# Patient Record
Sex: Female | Born: 1965
Health system: Southern US, Community
[De-identification: ages and names within clinical notes are randomized; demographics above are authoritative.]

## PROBLEM LIST (undated history)

## (undated) DIAGNOSIS — M549 Dorsalgia, unspecified: Secondary | ICD-10-CM

## (undated) DIAGNOSIS — I6529 Occlusion and stenosis of unspecified carotid artery: Secondary | ICD-10-CM

## (undated) DIAGNOSIS — I10 Essential (primary) hypertension: Secondary | ICD-10-CM

## (undated) DIAGNOSIS — K219 Gastro-esophageal reflux disease without esophagitis: Secondary | ICD-10-CM

## (undated) HISTORY — PX: SHOULDER SURGERY: SHX246

## (undated) HISTORY — DX: Occlusion and stenosis of unspecified carotid artery: I65.29

## (undated) HISTORY — DX: Essential (primary) hypertension: I10

---

## 1998-07-09 ENCOUNTER — Ambulatory Visit (HOSPITAL_COMMUNITY): Admission: RE | Admit: 1998-07-09 | Discharge: 1998-07-09 | Payer: Self-pay | Admitting: Obstetrics

## 1998-08-18 ENCOUNTER — Encounter: Payer: Self-pay | Admitting: Obstetrics

## 1998-08-18 ENCOUNTER — Ambulatory Visit (HOSPITAL_COMMUNITY): Admission: RE | Admit: 1998-08-18 | Discharge: 1998-08-18 | Payer: Self-pay | Admitting: Obstetrics

## 1999-02-02 ENCOUNTER — Other Ambulatory Visit: Admission: RE | Admit: 1999-02-02 | Discharge: 1999-02-02 | Payer: Self-pay | Admitting: Obstetrics and Gynecology

## 1999-05-22 ENCOUNTER — Encounter: Payer: Self-pay | Admitting: Obstetrics and Gynecology

## 1999-05-22 ENCOUNTER — Inpatient Hospital Stay (HOSPITAL_COMMUNITY): Admission: AD | Admit: 1999-05-22 | Discharge: 1999-06-19 | Payer: Self-pay | Admitting: *Deleted

## 1999-07-15 ENCOUNTER — Other Ambulatory Visit: Admission: RE | Admit: 1999-07-15 | Discharge: 1999-07-15 | Payer: Self-pay | Admitting: Obstetrics and Gynecology

## 2000-08-28 ENCOUNTER — Emergency Department (HOSPITAL_COMMUNITY): Admission: EM | Admit: 2000-08-28 | Discharge: 2000-08-28 | Payer: Self-pay | Admitting: Emergency Medicine

## 2001-04-12 ENCOUNTER — Other Ambulatory Visit: Admission: RE | Admit: 2001-04-12 | Discharge: 2001-04-12 | Payer: Self-pay | Admitting: Obstetrics and Gynecology

## 2001-04-28 ENCOUNTER — Encounter: Admission: RE | Admit: 2001-04-28 | Discharge: 2001-07-27 | Payer: Self-pay | Admitting: Family Medicine

## 2001-08-29 ENCOUNTER — Encounter: Admission: RE | Admit: 2001-08-29 | Discharge: 2001-11-27 | Payer: Self-pay | Admitting: Family Medicine

## 2004-03-08 ENCOUNTER — Emergency Department (HOSPITAL_COMMUNITY): Admission: EM | Admit: 2004-03-08 | Discharge: 2004-03-08 | Payer: Self-pay | Admitting: Emergency Medicine

## 2004-03-08 ENCOUNTER — Encounter (INDEPENDENT_AMBULATORY_CARE_PROVIDER_SITE_OTHER): Payer: Self-pay | Admitting: *Deleted

## 2004-03-12 ENCOUNTER — Inpatient Hospital Stay (HOSPITAL_COMMUNITY): Admission: AD | Admit: 2004-03-12 | Discharge: 2004-03-12 | Payer: Self-pay | Admitting: Obstetrics & Gynecology

## 2004-03-15 ENCOUNTER — Inpatient Hospital Stay (HOSPITAL_COMMUNITY): Admission: AD | Admit: 2004-03-15 | Discharge: 2004-03-15 | Payer: Self-pay | Admitting: Obstetrics & Gynecology

## 2004-04-07 ENCOUNTER — Encounter: Admission: RE | Admit: 2004-04-07 | Discharge: 2004-04-07 | Payer: Self-pay | Admitting: Obstetrics and Gynecology

## 2004-04-09 ENCOUNTER — Ambulatory Visit (HOSPITAL_COMMUNITY): Admission: RE | Admit: 2004-04-09 | Discharge: 2004-04-09 | Payer: Self-pay | Admitting: Obstetrics and Gynecology

## 2004-06-24 ENCOUNTER — Ambulatory Visit: Payer: Self-pay | Admitting: Nurse Practitioner

## 2004-06-25 ENCOUNTER — Ambulatory Visit: Payer: Self-pay | Admitting: Nurse Practitioner

## 2004-06-29 ENCOUNTER — Ambulatory Visit (HOSPITAL_COMMUNITY): Admission: RE | Admit: 2004-06-29 | Discharge: 2004-06-29 | Payer: Self-pay | Admitting: Internal Medicine

## 2004-07-20 ENCOUNTER — Ambulatory Visit: Payer: Self-pay | Admitting: Nurse Practitioner

## 2004-07-23 ENCOUNTER — Ambulatory Visit (HOSPITAL_COMMUNITY): Admission: RE | Admit: 2004-07-23 | Discharge: 2004-07-23 | Payer: Self-pay | Admitting: Internal Medicine

## 2004-08-14 ENCOUNTER — Ambulatory Visit: Payer: Self-pay | Admitting: Nurse Practitioner

## 2004-10-08 ENCOUNTER — Ambulatory Visit: Payer: Self-pay | Admitting: Nurse Practitioner

## 2004-10-09 ENCOUNTER — Ambulatory Visit: Payer: Self-pay | Admitting: *Deleted

## 2004-10-10 ENCOUNTER — Ambulatory Visit (HOSPITAL_COMMUNITY): Admission: RE | Admit: 2004-10-10 | Discharge: 2004-10-10 | Payer: Self-pay | Admitting: Internal Medicine

## 2004-10-30 ENCOUNTER — Ambulatory Visit: Payer: Self-pay | Admitting: Nurse Practitioner

## 2004-11-20 ENCOUNTER — Ambulatory Visit: Payer: Self-pay | Admitting: Nurse Practitioner

## 2004-12-08 ENCOUNTER — Ambulatory Visit: Payer: Self-pay | Admitting: Nurse Practitioner

## 2005-01-07 ENCOUNTER — Ambulatory Visit: Payer: Self-pay | Admitting: Nurse Practitioner

## 2005-02-04 ENCOUNTER — Ambulatory Visit: Payer: Self-pay | Admitting: Nurse Practitioner

## 2005-04-26 ENCOUNTER — Emergency Department (HOSPITAL_COMMUNITY): Admission: EM | Admit: 2005-04-26 | Discharge: 2005-04-27 | Payer: Self-pay | Admitting: Emergency Medicine

## 2006-02-14 ENCOUNTER — Emergency Department (HOSPITAL_COMMUNITY): Admission: EM | Admit: 2006-02-14 | Discharge: 2006-02-14 | Payer: Self-pay | Admitting: Emergency Medicine

## 2006-05-25 ENCOUNTER — Emergency Department (HOSPITAL_COMMUNITY): Admission: EM | Admit: 2006-05-25 | Discharge: 2006-05-26 | Payer: Self-pay | Admitting: *Deleted

## 2006-05-26 ENCOUNTER — Inpatient Hospital Stay (HOSPITAL_COMMUNITY): Admission: RE | Admit: 2006-05-26 | Discharge: 2006-05-28 | Payer: Self-pay | Admitting: Internal Medicine

## 2006-05-31 ENCOUNTER — Ambulatory Visit: Payer: Self-pay | Admitting: Nurse Practitioner

## 2006-06-16 ENCOUNTER — Ambulatory Visit: Payer: Self-pay | Admitting: Nurse Practitioner

## 2006-08-23 ENCOUNTER — Ambulatory Visit: Payer: Self-pay | Admitting: Nurse Practitioner

## 2006-08-31 ENCOUNTER — Encounter (INDEPENDENT_AMBULATORY_CARE_PROVIDER_SITE_OTHER): Payer: Self-pay | Admitting: Specialist

## 2006-08-31 ENCOUNTER — Inpatient Hospital Stay (HOSPITAL_COMMUNITY): Admission: AD | Admit: 2006-08-31 | Discharge: 2006-08-31 | Payer: Self-pay | Admitting: Gynecology

## 2006-09-07 ENCOUNTER — Inpatient Hospital Stay (HOSPITAL_COMMUNITY): Admission: AD | Admit: 2006-09-07 | Discharge: 2006-09-07 | Payer: Self-pay | Admitting: Gynecology

## 2006-09-17 ENCOUNTER — Ambulatory Visit (HOSPITAL_COMMUNITY): Admission: RE | Admit: 2006-09-17 | Discharge: 2006-09-17 | Payer: Self-pay | Admitting: Family Medicine

## 2006-09-19 ENCOUNTER — Ambulatory Visit: Payer: Self-pay | Admitting: Nurse Practitioner

## 2006-10-04 ENCOUNTER — Encounter (INDEPENDENT_AMBULATORY_CARE_PROVIDER_SITE_OTHER): Payer: Self-pay | Admitting: Nurse Practitioner

## 2006-10-04 ENCOUNTER — Ambulatory Visit: Payer: Self-pay | Admitting: Nurse Practitioner

## 2006-10-06 ENCOUNTER — Encounter (INDEPENDENT_AMBULATORY_CARE_PROVIDER_SITE_OTHER): Payer: Self-pay | Admitting: Nurse Practitioner

## 2006-10-10 ENCOUNTER — Encounter: Admission: RE | Admit: 2006-10-10 | Discharge: 2006-10-10 | Payer: Self-pay | Admitting: Family Medicine

## 2006-10-28 ENCOUNTER — Encounter: Admission: RE | Admit: 2006-10-28 | Discharge: 2006-12-09 | Payer: Self-pay | Admitting: Orthopaedic Surgery

## 2006-12-09 ENCOUNTER — Ambulatory Visit (HOSPITAL_COMMUNITY): Admission: RE | Admit: 2006-12-09 | Discharge: 2006-12-09 | Payer: Self-pay | Admitting: Orthopaedic Surgery

## 2006-12-14 ENCOUNTER — Encounter: Admission: RE | Admit: 2006-12-14 | Discharge: 2007-03-14 | Payer: Self-pay | Admitting: Orthopaedic Surgery

## 2006-12-23 ENCOUNTER — Ambulatory Visit: Payer: Self-pay | Admitting: Nurse Practitioner

## 2007-03-15 ENCOUNTER — Encounter: Admission: RE | Admit: 2007-03-15 | Discharge: 2007-06-13 | Payer: Self-pay | Admitting: Orthopaedic Surgery

## 2007-05-05 ENCOUNTER — Encounter (INDEPENDENT_AMBULATORY_CARE_PROVIDER_SITE_OTHER): Payer: Self-pay | Admitting: Nurse Practitioner

## 2007-05-08 DIAGNOSIS — M7580 Other shoulder lesions, unspecified shoulder: Secondary | ICD-10-CM

## 2007-05-08 DIAGNOSIS — E1165 Type 2 diabetes mellitus with hyperglycemia: Secondary | ICD-10-CM | POA: Insufficient documentation

## 2007-05-08 DIAGNOSIS — J309 Allergic rhinitis, unspecified: Secondary | ICD-10-CM | POA: Insufficient documentation

## 2007-05-08 DIAGNOSIS — M545 Low back pain: Secondary | ICD-10-CM

## 2007-05-08 DIAGNOSIS — E119 Type 2 diabetes mellitus without complications: Secondary | ICD-10-CM

## 2007-05-31 ENCOUNTER — Encounter (INDEPENDENT_AMBULATORY_CARE_PROVIDER_SITE_OTHER): Payer: Self-pay | Admitting: *Deleted

## 2007-06-28 ENCOUNTER — Emergency Department (HOSPITAL_COMMUNITY): Admission: EM | Admit: 2007-06-28 | Discharge: 2007-06-29 | Payer: Self-pay | Admitting: Emergency Medicine

## 2007-08-28 ENCOUNTER — Encounter: Admission: RE | Admit: 2007-08-28 | Discharge: 2007-08-28 | Payer: Self-pay | Admitting: Internal Medicine

## 2008-02-03 ENCOUNTER — Encounter: Admission: RE | Admit: 2008-02-03 | Discharge: 2008-02-03 | Payer: Self-pay | Admitting: Orthopaedic Surgery

## 2008-02-23 ENCOUNTER — Encounter: Admission: RE | Admit: 2008-02-23 | Discharge: 2008-03-06 | Payer: Self-pay | Admitting: Orthopaedic Surgery

## 2008-06-19 IMAGING — US US ABDOMEN COMPLETE
1 series · 14 of 25 positions shown · non-contrast
Comparison: None

CLINICAL DATA: Abdominal pain.

COMPLETE ABDOMINAL ULTRASOUND  09/17/2006:

[Series 1: unknown · 0.33mm/px · 14 of 56 slices shown]
[im 1/56]
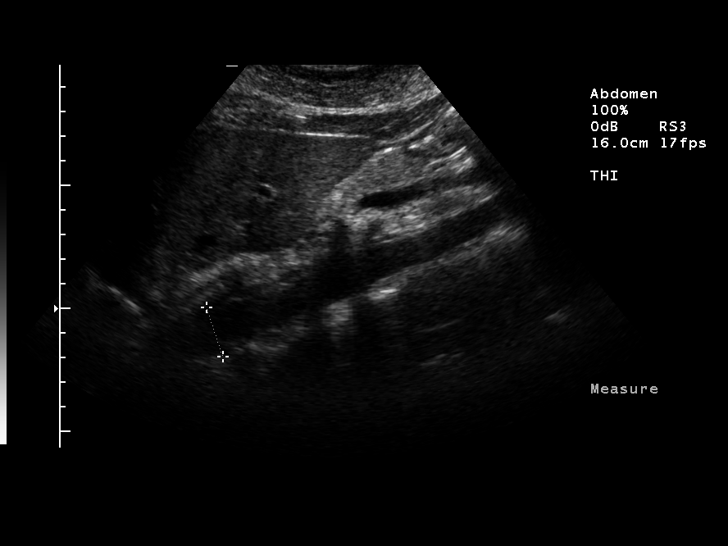
[im 5/56]
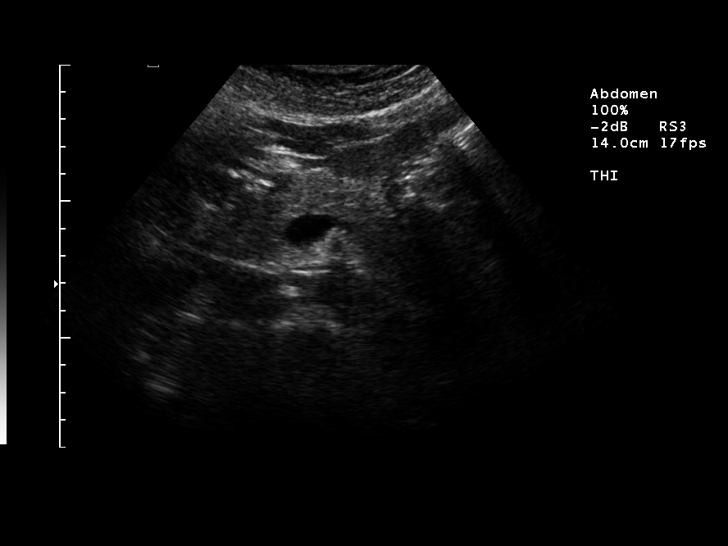
[im 10/56]
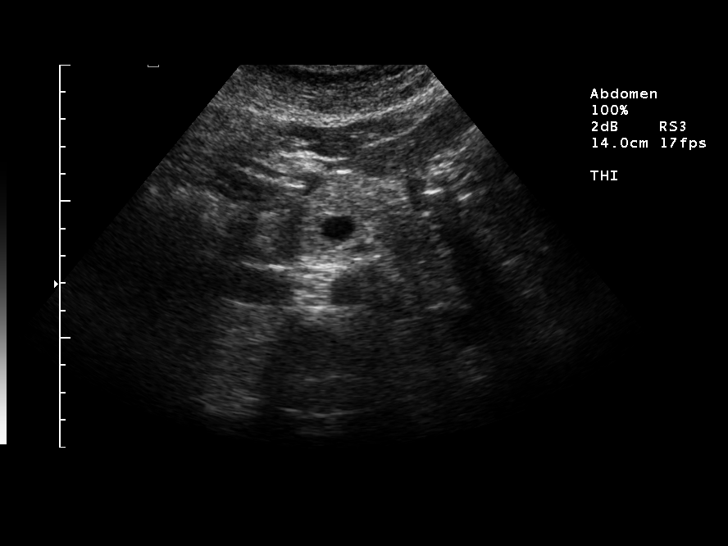
[im 14/56]
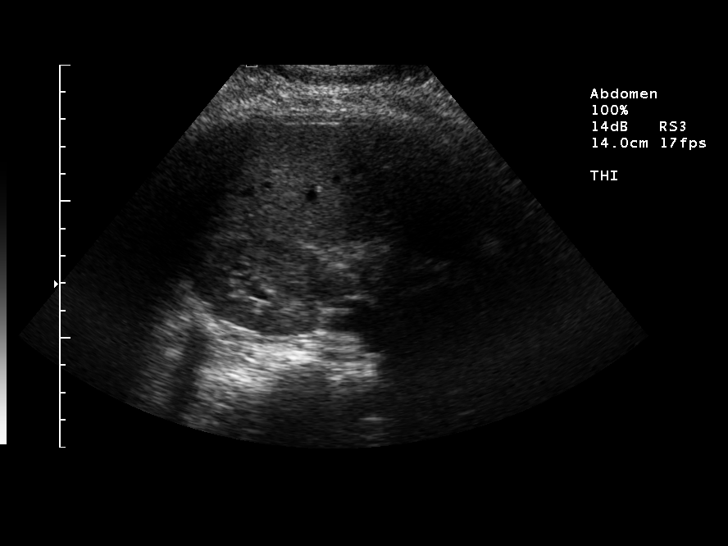
[im 19/56]
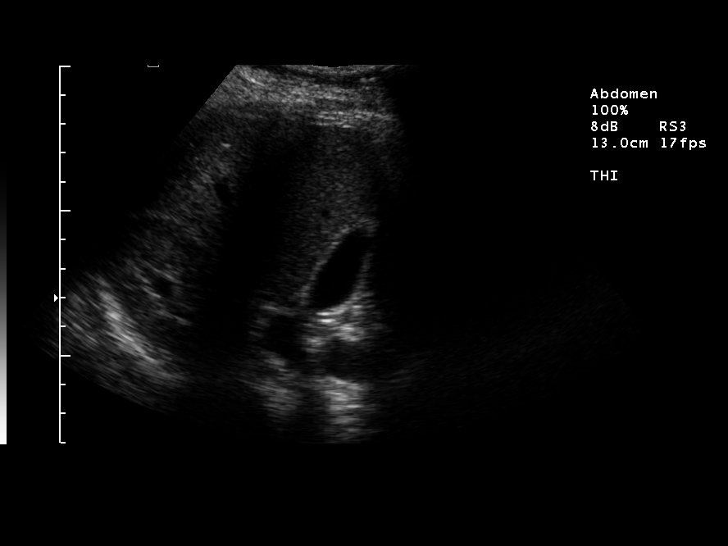
[im 21/56]
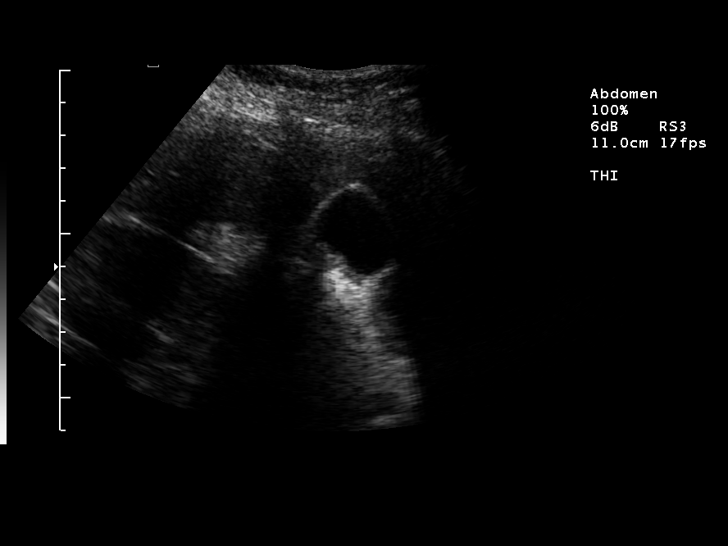
[im 26/56]
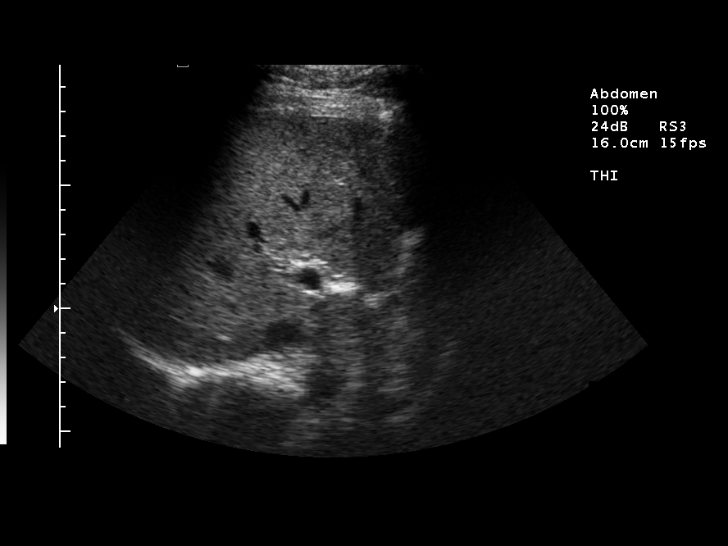
[im 30/56]
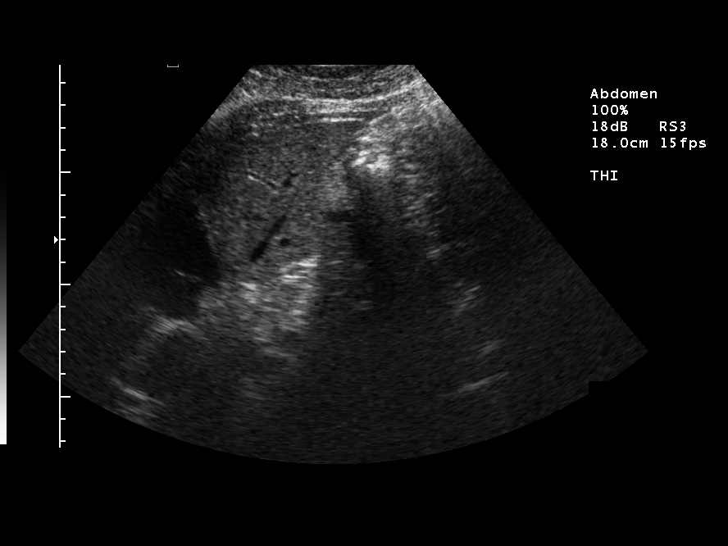
[im 35/56]
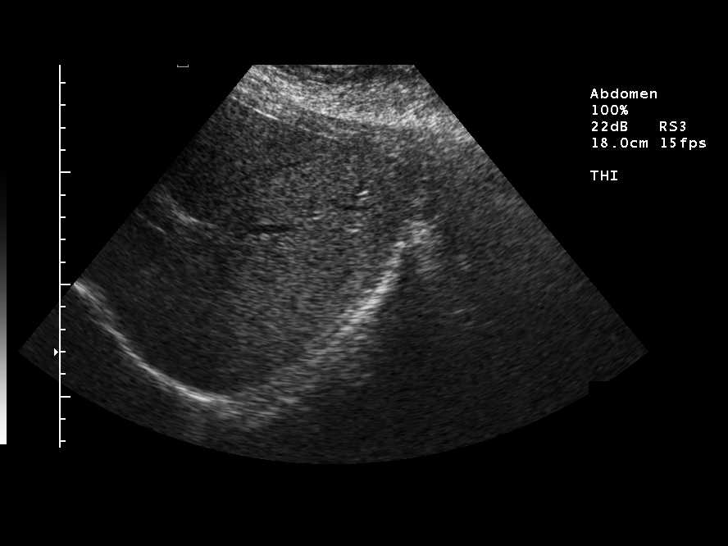
[im 37/56]
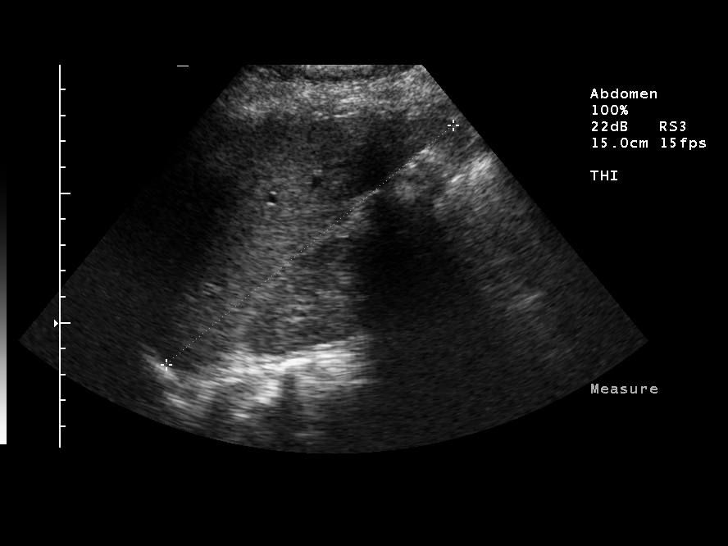
[im 42/56]
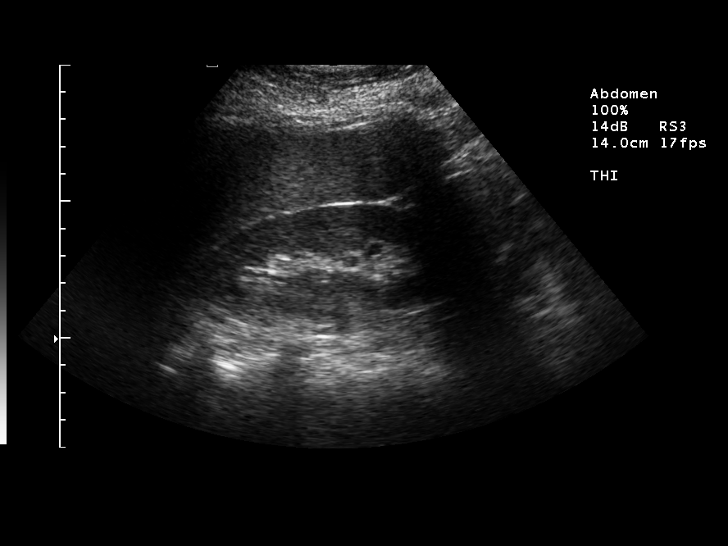
[im 46/56]
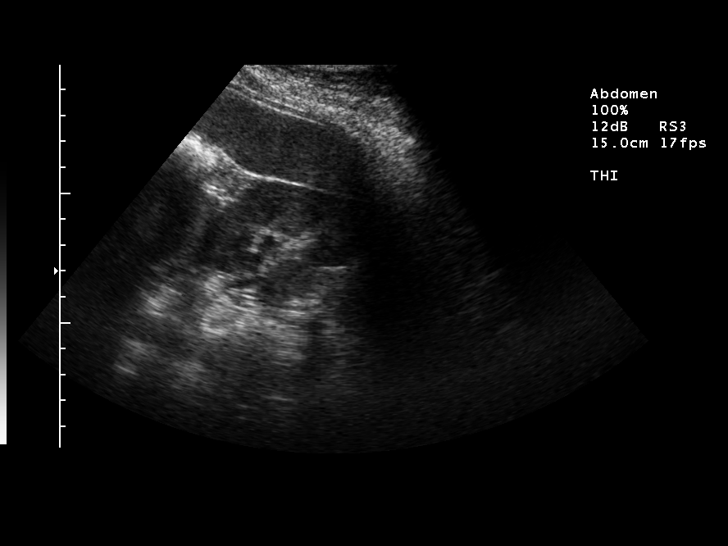
[im 51/56]
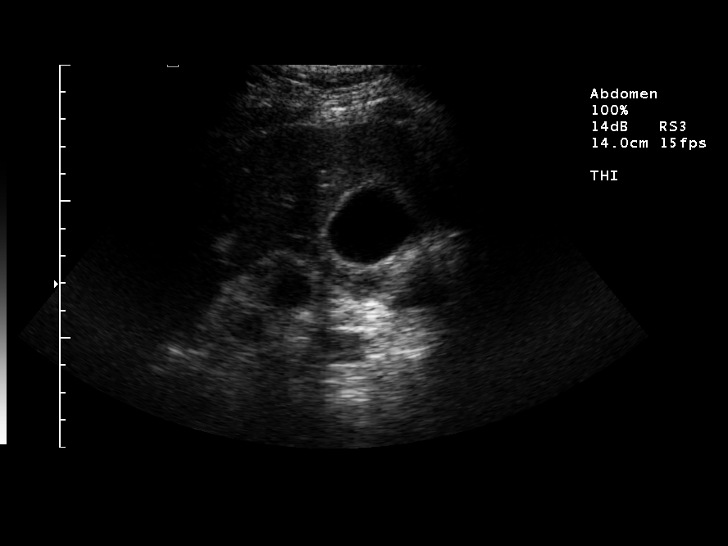
[im 56/56]
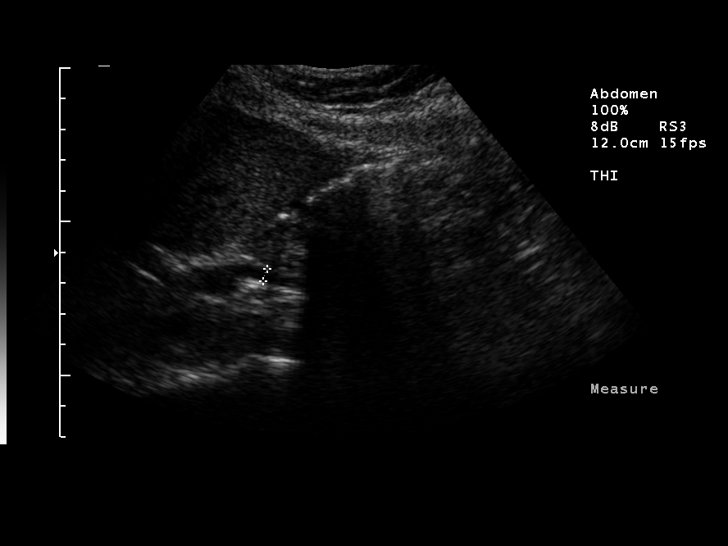

[14 of 25 positions shown; findings below may reference images not displayed]

FINDINGS: Gallbladder:  No gallstones. No gallbladder wall thickening or pericholecystic
fluid.

Common bile duct: Normal in caliber measuring 4 mm diameter.

Liver:  Normal size and echotexture without focal parenchymal abnormality.

Inferior vena cava:  Patent.

Pancreas:  Normal size and echotexture without focal parenchymal abnormality.

Spleen:  No focal parenchymal abnormalities. Borderline enlarged measuring
approximately 13.4 cm in length.

Right kidney:  No hydronephrosis. Well-preserved cortex. No focal parenchymal
abnormalities. Approximately 11.4 cm in length.

Left kidney:  No hydronephrosis there well-preserved cortex. No focal
parenchymal abnormalities. Approximately 10.67 meters in length.

Abdominal aorta:  Normal in appearance with maximum diameter approximately 2 cm.
IMPRESSION: Borderline splenomegaly. Normal abdominal ultrasound otherwise.

## 2008-06-24 ENCOUNTER — Encounter: Admission: RE | Admit: 2008-06-24 | Discharge: 2008-06-24 | Payer: Self-pay | Admitting: Gastroenterology

## 2008-07-24 ENCOUNTER — Ambulatory Visit (HOSPITAL_COMMUNITY): Admission: RE | Admit: 2008-07-24 | Discharge: 2008-07-24 | Payer: Self-pay | Admitting: Gastroenterology

## 2008-07-26 ENCOUNTER — Ambulatory Visit (HOSPITAL_COMMUNITY): Admission: RE | Admit: 2008-07-26 | Discharge: 2008-07-26 | Payer: Self-pay | Admitting: Gastroenterology

## 2009-01-06 ENCOUNTER — Emergency Department (HOSPITAL_COMMUNITY): Admission: EM | Admit: 2009-01-06 | Discharge: 2009-01-06 | Payer: Self-pay | Admitting: Emergency Medicine

## 2010-09-02 ENCOUNTER — Encounter (INDEPENDENT_AMBULATORY_CARE_PROVIDER_SITE_OTHER): Payer: Self-pay | Admitting: Family Medicine

## 2010-09-02 LAB — CONVERTED CEMR LAB
AST: 13 units/L (ref 0–37)
Albumin: 4 g/dL (ref 3.5–5.2)
Alkaline Phosphatase: 70 units/L (ref 39–117)
BUN: 11 mg/dL (ref 6–23)
Basophils Relative: 1 % (ref 0–1)
Creatinine, Ser: 0.76 mg/dL (ref 0.40–1.20)
Eosinophils Absolute: 0 10*3/uL (ref 0.0–0.7)
Eosinophils Relative: 1 % (ref 0–5)
Glucose, Bld: 312 mg/dL — ABNORMAL HIGH (ref 70–99)
HCT: 38.2 % (ref 36.0–46.0)
HDL: 52 mg/dL (ref >39)
LDL Cholesterol: 72 mg/dL (ref 0–99)
Lymphs Abs: 2.2 10*3/uL (ref 0.7–4.0)
MCHC: 31.9 g/dL (ref 30.0–36.0)
MCV: 80.9 fL (ref 78.0–100.0)
Monocytes Absolute: 0.4 10*3/uL (ref 0.1–1.0)
Monocytes Relative: 5 % (ref 3–12)
Neutrophils Relative %: 65 % (ref 43–77)
Potassium: 4.3 meq/L (ref 3.5–5.3)
RBC: 4.72 M/uL (ref 3.87–5.11)
Total Bilirubin: 0.3 mg/dL (ref 0.3–1.2)
Total CHOL/HDL Ratio: 2.7
Triglycerides: 81 mg/dL (ref <150)
VLDL: 16 mg/dL (ref 0–40)
WBC: 7.7 10*3/uL (ref 4.0–10.5)

## 2010-10-04 ENCOUNTER — Encounter: Payer: Self-pay | Admitting: Internal Medicine

## 2010-12-23 LAB — URINE MICROSCOPIC-ADD ON

## 2010-12-23 LAB — URINE CULTURE

## 2010-12-23 LAB — GLUCOSE, CAPILLARY: Glucose-Capillary: 116 mg/dL — ABNORMAL HIGH (ref 70–99)

## 2010-12-23 LAB — URINALYSIS, ROUTINE W REFLEX MICROSCOPIC
Glucose, UA: NEGATIVE mg/dL
Protein, ur: NEGATIVE mg/dL
Specific Gravity, Urine: 1.008 (ref 1.005–1.030)
Urobilinogen, UA: 0.2 mg/dL (ref 0.0–1.0)

## 2010-12-23 LAB — BASIC METABOLIC PANEL
BUN: 10 mg/dL (ref 6–23)
Calcium: 9.3 mg/dL (ref 8.4–10.5)
Chloride: 105 mEq/L (ref 96–112)
Creatinine, Ser: 0.64 mg/dL (ref 0.4–1.2)
GFR calc Af Amer: >60 mL/min (ref >60)

## 2010-12-23 LAB — DIFFERENTIAL
Basophils Relative: 1 % (ref 0–1)
Eosinophils Absolute: 0.1 10*3/uL (ref 0.0–0.7)
Lymphs Abs: 3 10*3/uL (ref 0.7–4.0)
Neutro Abs: 6.2 10*3/uL (ref 1.7–7.7)
Neutrophils Relative %: 65 % (ref 43–77)

## 2010-12-23 LAB — RAPID STREP SCREEN (MED CTR MEBANE ONLY): Streptococcus, Group A Screen (Direct): NEGATIVE

## 2010-12-23 LAB — CBC
MCV: 76.1 fL — ABNORMAL LOW (ref 78.0–100.0)
Platelets: 237 10*3/uL (ref 150–400)
RBC: 4.53 MIL/uL (ref 3.87–5.11)
WBC: 9.7 10*3/uL (ref 4.0–10.5)

## 2011-01-06 ENCOUNTER — Other Ambulatory Visit (HOSPITAL_COMMUNITY): Payer: Self-pay | Admitting: Family Medicine

## 2011-01-06 ENCOUNTER — Ambulatory Visit (HOSPITAL_COMMUNITY)
Admission: RE | Admit: 2011-01-06 | Discharge: 2011-01-06 | Disposition: A | Discharge status: Home / Self Care | Payer: Self-pay | Source: Ambulatory Visit | Attending: Family Medicine | Admitting: Family Medicine

## 2011-01-06 DIAGNOSIS — M25569 Pain in unspecified knee: Secondary | ICD-10-CM | POA: Insufficient documentation

## 2011-01-06 DIAGNOSIS — M545 Low back pain, unspecified: Secondary | ICD-10-CM | POA: Insufficient documentation

## 2011-01-06 DIAGNOSIS — M25559 Pain in unspecified hip: Secondary | ICD-10-CM | POA: Insufficient documentation

## 2011-01-29 NOTE — H&P (Signed)
[name], Robertson                  ACCOUNT NO.:  [id number]   MEDICAL RECORD NO.:  [id number]          PATIENT TYPE:  INP   LOCATION:  1833                         FACILITY:  [facility]   PHYSICIAN:  Deirdre Peer. Polite, M.D. DATE OF BIRTH:  [birth year]   DATE OF ADMISSION:  05/25/2006  DATE OF DISCHARGE:                                HISTORY & PHYSICAL   CHIEF COMPLAINT:  Abdominal pain, nausea, vomiting, fever.   HISTORY OF PRESENT ILLNESS:  A 45 year old female from [place] brought to  the ED by her husband for a three-day history related above in chief  complaint of nausea, vomiting, abdominal pain and temperature.  He stated  his wife has not been able to keep much down over the last couple of days  because of the above-symptoms.  Does admit to some urinary frequency and  dysuria.  In the ED, the patient is evaluated and found to be febrile with  temperature max of 103.3, BP 109/59, pulse 109, respiratory rate 20.   LABS:  UA specific gravity of 1.030, leukocyte esterase and nitrite  positive.  It shows a few bacteria, 21-50 WBCs.  The patient had a CBC with  a white count of 15,000.  BMET essentially unremarkable.   Because of the patient's symptoms, it was felt that admission was deemed  necessary for probable pyelonephritis.  Due to the language barriers, all  other H&P is essentially taken from the patient's husband.  Admission is  deemed necessary for further evaluation and treatment.   PAST MEDICAL HISTORY:  The patient has a history of diabetes but has been  without medications greater than one year.  Denies any other medical  problems.   MEDICATIONS:  Tylenol p.r.n.   SOCIAL HISTORY:  Negative for tobacco, alcohol or drugs.   PAST SURGICAL HISTORY:  C-section in the past.   ALLERGIES:  None.   FAMILY HISTORY:  Noncontributory.   REVIEW OF SYSTEMS:  As stated in the HPI.   PHYSICAL EXAMINATION:  GENERAL:  The patient is in mild distress secondary  to abdominal  discomfort.  VITAL SIGNS:  Temperature max 103.3, BP 109/59, pulse 109, respiratory rate  20, saturating 98%.  HEENT:  Pale sclerae.  Moist oral mucosa.  No nodes.  No JVD.  CHEST:  Clear to auscultation bilaterally without rales, rhonchi or wheezes.  CARDIOVASCULAR:  Regular.  No S3.  ABDOMEN:  Diffuse tenderness with minimal palpation, mild guarding.  No  rebound tenderness.  No mass appreciated.  EXTREMITIES:  No clubbing, cyanosis, or edema.  There are 2+ pulses.  NEUROLOGICAL:  Nonfocal.   LABORATORY DATA:  As stated in the HPI.   ASSESSMENT:  1. Urinary tract infection, probable pyelonephritis.  2. Fever.  3. Leukocytosis.  4. Nausea, vomiting and abdominal pain.  5. History of diabetes off medications greater than one year.   RECOMMENDATIONS:  The patient to be admitted to telemetry for bed.  The  patient will be started on IV fluids, IV antibiotics and will be  pancultured.  The patient will get a CT of the abdomen and  pelvis to rule  out any other occult abdominal pathology.  We will make further  recommendations as deemed necessary.      Deirdre Peer. Polite, M.D.  Electronically Signed     RDP/MEDQ  D:  05/26/2006  T:  05/26/2006  Job:  [id number]

## 2011-01-29 NOTE — Group Therapy Note (Signed)
NAMEMIKYA, DON NO.:  [id number]   MEDICAL RECORD NO.:  [id number]                   PATIENT TYPE:  OUT   LOCATION:  [facility]                           FACILITY:  [facility]   PHYSICIAN:  Argentina Donovan, MD                     DATE OF BIRTH:  [birth year]   DATE OF SERVICE:                                    CLINIC NOTE   The patient is a 45 year old [place] African female from [place] who is a  gravida 12, para 10-09-13-08, with last two babies born by cesarean section  who has spontaneous first trimester miscarriage in late June and was seen at  [facility].  She has been using Depo-Provera for contraception mainly because she  does not like pills around the house with her young children, the youngest  of which is 99 years old, that like to take pills.  Since the pregnancy,  she has had low back pain in the sacroiliac area and is extremely tender to  palpation in that area, especially on her right side, but does not remember  an episode where she hurt her back, but she is having to take pain  medication continually.  An ultrasound of the pelvis was completely normal  that was done on March 12, 2004, and will follow this up with an MRI of the  lower back, lumbar and sacroiliac areas and probably send the patient for  orthopedic consult after that.  The impression is follow-up normal following  spontaneous first trimester abortion and severe lower back pain.                                               Argentina Donovan, MD    PR/MEDQ  D:  04/07/2004  T:  04/07/2004  Job:  [id number]
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
## 2011-06-15 ENCOUNTER — Ambulatory Visit: Payer: Medicaid Other | Attending: Family Medicine | Admitting: Rehabilitative and Restorative Service Providers"

## 2011-06-15 DIAGNOSIS — M546 Pain in thoracic spine: Secondary | ICD-10-CM | POA: Insufficient documentation

## 2011-06-15 DIAGNOSIS — M6281 Muscle weakness (generalized): Secondary | ICD-10-CM | POA: Insufficient documentation

## 2011-06-15 DIAGNOSIS — M545 Low back pain, unspecified: Secondary | ICD-10-CM | POA: Insufficient documentation

## 2011-06-15 DIAGNOSIS — M25519 Pain in unspecified shoulder: Secondary | ICD-10-CM | POA: Insufficient documentation

## 2011-06-15 DIAGNOSIS — IMO0001 Reserved for inherently not codable concepts without codable children: Secondary | ICD-10-CM | POA: Insufficient documentation

## 2011-06-17 ENCOUNTER — Ambulatory Visit: Payer: Medicaid Other | Admitting: Rehabilitation

## 2011-06-22 ENCOUNTER — Ambulatory Visit: Payer: Medicaid Other | Admitting: Rehabilitation

## 2011-06-23 LAB — DIFFERENTIAL
Eosinophils Absolute: 0.1
Eosinophils Relative: 1
Lymphocytes Relative: 25
Lymphs Abs: 2.8
Monocytes Absolute: 0.5

## 2011-06-23 LAB — COMPREHENSIVE METABOLIC PANEL
ALT: 13
AST: 16
Albumin: 3.5
CO2: 25
Calcium: 9.1
Chloride: 104
Creatinine, Ser: 0.71
GFR calc Af Amer: >60
Sodium: 136
Total Bilirubin: 0.3

## 2011-06-23 LAB — CBC
MCV: 73.7 — ABNORMAL LOW
Platelets: 240
RBC: 4.95
WBC: 11.5 — ABNORMAL HIGH

## 2011-06-23 LAB — URINE MICROSCOPIC-ADD ON

## 2011-06-23 LAB — URINALYSIS, ROUTINE W REFLEX MICROSCOPIC
Bilirubin Urine: NEGATIVE
Ketones, ur: NEGATIVE
Specific Gravity, Urine: 1.027
pH: 6.5

## 2011-06-23 LAB — POCT PREGNANCY, URINE: Preg Test, Ur: NEGATIVE

## 2011-06-29 ENCOUNTER — Ambulatory Visit: Payer: Medicaid Other | Admitting: Rehabilitation

## 2011-07-01 ENCOUNTER — Ambulatory Visit: Payer: Medicaid Other | Admitting: Rehabilitation

## 2011-07-05 ENCOUNTER — Ambulatory Visit: Payer: Medicaid Other | Admitting: Rehabilitative and Restorative Service Providers"

## 2011-07-07 ENCOUNTER — Encounter: Payer: Self-pay | Admitting: Rehabilitation

## 2011-07-14 ENCOUNTER — Ambulatory Visit: Payer: Medicaid Other | Admitting: Rehabilitation

## 2011-07-15 ENCOUNTER — Ambulatory Visit: Payer: Medicaid Other | Attending: Family Medicine | Admitting: Rehabilitative and Restorative Service Providers"

## 2011-07-15 DIAGNOSIS — IMO0001 Reserved for inherently not codable concepts without codable children: Secondary | ICD-10-CM | POA: Insufficient documentation

## 2011-07-15 DIAGNOSIS — M545 Low back pain, unspecified: Secondary | ICD-10-CM | POA: Insufficient documentation

## 2011-07-15 DIAGNOSIS — M546 Pain in thoracic spine: Secondary | ICD-10-CM | POA: Insufficient documentation

## 2011-07-15 DIAGNOSIS — M25519 Pain in unspecified shoulder: Secondary | ICD-10-CM | POA: Insufficient documentation

## 2011-07-15 DIAGNOSIS — M2560 Stiffness of unspecified joint, not elsewhere classified: Secondary | ICD-10-CM | POA: Insufficient documentation

## 2011-07-15 DIAGNOSIS — M6281 Muscle weakness (generalized): Secondary | ICD-10-CM | POA: Insufficient documentation

## 2011-07-19 ENCOUNTER — Encounter: Payer: Self-pay | Admitting: Rehabilitative and Restorative Service Providers"

## 2011-07-21 ENCOUNTER — Encounter: Payer: Self-pay | Admitting: Rehabilitative and Restorative Service Providers"

## 2011-07-23 ENCOUNTER — Encounter: Payer: Self-pay | Admitting: Physical Therapy

## 2011-07-27 ENCOUNTER — Encounter: Payer: Self-pay | Admitting: Rehabilitative and Restorative Service Providers"

## 2011-07-28 ENCOUNTER — Encounter: Payer: Self-pay | Admitting: Rehabilitative and Restorative Service Providers"

## 2011-07-30 ENCOUNTER — Encounter: Payer: Self-pay | Admitting: Physical Therapy

## 2011-08-02 ENCOUNTER — Encounter: Payer: Self-pay | Admitting: Rehabilitative and Restorative Service Providers"

## 2011-08-03 ENCOUNTER — Encounter: Payer: Self-pay | Admitting: Rehabilitative and Restorative Service Providers"

## 2011-08-04 ENCOUNTER — Encounter: Payer: Self-pay | Admitting: Physical Therapy

## 2012-03-12 ENCOUNTER — Emergency Department (HOSPITAL_COMMUNITY): Payer: Medicaid Other

## 2012-03-12 ENCOUNTER — Encounter (HOSPITAL_COMMUNITY): Payer: Self-pay | Admitting: Emergency Medicine

## 2012-03-12 ENCOUNTER — Emergency Department (HOSPITAL_COMMUNITY)
Admission: EM | Admit: 2012-03-12 | Discharge: 2012-03-13 | Disposition: A | Discharge status: Home / Self Care | Payer: Medicaid Other | Source: Home / Self Care | Attending: Emergency Medicine | Admitting: Emergency Medicine

## 2012-03-12 DIAGNOSIS — M7989 Other specified soft tissue disorders: Secondary | ICD-10-CM | POA: Insufficient documentation

## 2012-03-12 DIAGNOSIS — R0602 Shortness of breath: Secondary | ICD-10-CM | POA: Insufficient documentation

## 2012-03-12 DIAGNOSIS — R05 Cough: Secondary | ICD-10-CM | POA: Insufficient documentation

## 2012-03-12 DIAGNOSIS — M549 Dorsalgia, unspecified: Secondary | ICD-10-CM | POA: Insufficient documentation

## 2012-03-12 DIAGNOSIS — Z79899 Other long term (current) drug therapy: Secondary | ICD-10-CM | POA: Insufficient documentation

## 2012-03-12 DIAGNOSIS — E119 Type 2 diabetes mellitus without complications: Secondary | ICD-10-CM | POA: Insufficient documentation

## 2012-03-12 DIAGNOSIS — R059 Cough, unspecified: Secondary | ICD-10-CM | POA: Insufficient documentation

## 2012-03-12 DIAGNOSIS — I10 Essential (primary) hypertension: Secondary | ICD-10-CM | POA: Insufficient documentation

## 2012-03-12 HISTORY — DX: Essential (primary) hypertension: I10

## 2012-03-12 HISTORY — DX: Dorsalgia, unspecified: M54.9

## 2012-03-12 LAB — BASIC METABOLIC PANEL
Chloride: 98 mEq/L (ref 96–112)
GFR calc non Af Amer: 75 mL/min — ABNORMAL LOW (ref >90)
Glucose, Bld: 346 mg/dL — ABNORMAL HIGH (ref 70–99)
Potassium: 4.1 mEq/L (ref 3.5–5.1)
Sodium: 136 mEq/L (ref 135–145)

## 2012-03-12 LAB — CBC WITH DIFFERENTIAL/PLATELET
Eosinophils Absolute: 0.1 10*3/uL (ref 0.0–0.7)
Hemoglobin: 11.5 g/dL — ABNORMAL LOW (ref 12.0–15.0)
Lymphocytes Relative: 24 % (ref 12–46)
Lymphs Abs: 2.6 10*3/uL (ref 0.7–4.0)
MCH: 26 pg (ref 26.0–34.0)
Neutro Abs: 8 10*3/uL — ABNORMAL HIGH (ref 1.7–7.7)
Neutrophils Relative %: 72 % (ref 43–77)
Platelets: 194 10*3/uL (ref 150–400)
RBC: 4.43 MIL/uL (ref 3.87–5.11)
WBC: 11 10*3/uL — ABNORMAL HIGH (ref 4.0–10.5)

## 2012-03-12 MED ORDER — ENOXAPARIN SODIUM 30 MG/0.3ML ~~LOC~~ SOLN: 30.0000 mg | Freq: Two times a day (BID) | SUBCUTANEOUS | Status: DC

## 2012-03-12 MED ORDER — MORPHINE SULFATE 4 MG/ML IJ SOLN
4.0000 mg | Freq: Once | INTRAMUSCULAR | Status: AC
  Administered 2012-03-12: 4 mg via INTRAVENOUS
  Filled 2012-03-12: qty 1

## 2012-03-12 MED ORDER — IOHEXOL 350 MG/ML SOLN
100.0000 mL | Freq: Once | INTRAVENOUS | Status: AC | PRN
  Administered 2012-03-12: 100 mL via INTRAVENOUS

## 2012-03-12 MED ORDER — HYDROCODONE-ACETAMINOPHEN 5-500 MG PO TABS: 1.0000 | ORAL_TABLET | Freq: Four times a day (QID) | ORAL | Status: AC | PRN

## 2012-03-12 MED ORDER — ENOXAPARIN SODIUM 80 MG/0.8ML ~~LOC~~ SOLN
1.0000 mg/kg | Freq: Once | SUBCUTANEOUS | Status: AC
  Administered 2012-03-12: 80 mg via SUBCUTANEOUS
  Filled 2012-03-12: qty 0.8

## 2012-03-12 MED ORDER — ONDANSETRON HCL 4 MG/2ML IJ SOLN
4.0000 mg | Freq: Once | INTRAMUSCULAR | Status: AC
  Administered 2012-03-13: 4 mg via INTRAVENOUS

## 2012-03-12 MED ORDER — CYCLOBENZAPRINE HCL 10 MG PO TABS: 5.0000 mg | ORAL_TABLET | Freq: Two times a day (BID) | ORAL | Status: AC | PRN

## 2012-03-12 MED ORDER — HYDROMORPHONE HCL 2 MG PO TABS: 1.0000 mg | ORAL_TABLET | Freq: Once | ORAL | Status: DC

## 2012-03-12 MED ORDER — ONDANSETRON HCL 4 MG/2ML IJ SOLN: 4.0000 mg | Freq: Once | INTRAMUSCULAR | Status: DC

## 2012-03-12 MED ORDER — HYDROMORPHONE HCL PF 1 MG/ML IJ SOLN
1.0000 mg | Freq: Once | INTRAMUSCULAR | Status: AC
  Administered 2012-03-12: 1 mg via INTRAVENOUS
  Filled 2012-03-12: qty 1

## 2012-03-12 NOTE — ED Notes (Signed)
The patient is AOx4 and comfortable with her discharge instructions.  The patient's husband is driving her home.

## 2012-03-12 NOTE — ED Notes (Signed)
C/o difficulty breathing and R upper back pain/swelling since Friday.

## 2012-03-12 NOTE — ED Notes (Signed)
Patient transported to X-ray 

## 2012-03-12 NOTE — Discharge Instructions (Signed)
Back Pain, Adult Low back pain is very common. About 1 in 5 people have back pain.The cause of low back pain is rarely dangerous. The pain often gets better over time.About half of people with a sudden onset of back pain feel better in just 2 weeks. About 8 in 10 people feel better by 6 weeks.  CAUSES Some common causes of back pain include:  Strain of the muscles or ligaments supporting the spine.   Wear and tear (degeneration) of the spinal discs.   Arthritis.   Direct injury to the back.  DIAGNOSIS Most of the time, the direct cause of low back pain is not known.However, back pain can be treated effectively even when the exact cause of the pain is unknown.Answering your caregiver's questions about your overall health and symptoms is one of the most accurate ways to make sure the cause of your pain is not dangerous. If your caregiver needs more information, he or she may order lab work or imaging tests (X-rays or MRIs).However, even if imaging tests show changes in your back, this usually does not require surgery. HOME CARE INSTRUCTIONS For many people, back pain returns.Since low back pain is rarely dangerous, it is often a condition that people can learn to manageon their own.   Remain active. It is stressful on the back to sit or stand in one place. Do not sit, drive, or stand in one place for more than 30 minutes at a time. Take short walks on level surfaces as soon as pain allows.Try to increase the length of time you walk each day.   Do not stay in bed.Resting more than 1 or 2 days can delay your recovery.   Do not avoid exercise or work.Your body is made to move.It is not dangerous to be active, even though your back may hurt.Your back will likely heal faster if you return to being active before your pain is gone.   Pay attention to your body when you bend and lift. Many people have less discomfortwhen lifting if they bend their knees, keep the load close to their  bodies,and avoid twisting. Often, the most comfortable positions are those that put less stress on your recovering back.   Find a comfortable position to sleep. Use a firm mattress and lie on your side with your knees slightly bent. If you lie on your back, put a pillow under your knees.   Only take over-the-counter or prescription medicines as directed by your caregiver. Over-the-counter medicines to reduce pain and inflammation are often the most helpful.Your caregiver may prescribe muscle relaxant drugs.These medicines help dull your pain so you can more quickly return to your normal activities and healthy exercise.   Put ice on the injured area.   Put ice in a plastic bag.   Place a towel between your skin and the bag.   Leave the ice on for 15 to 20 minutes, 3 to 4 times a day for the first 2 to 3 days. After that, ice and heat may be alternated to reduce pain and spasms.   Ask your caregiver about trying back exercises and gentle massage. This may be of some benefit.   Avoid feeling anxious or stressed.Stress increases muscle tension and can worsen back pain.It is important to recognize when you are anxious or stressed and learn ways to manage it.Exercise is a great option.  SEEK MEDICAL CARE IF:  You have pain that is not relieved with rest or medicine.   You have   pain that does not improve in 1 week.   You have new symptoms.   You are generally not feeling well.  SEEK IMMEDIATE MEDICAL CARE IF:   You have pain that radiates from your back into your legs.   You develop new bowel or bladder control problems.   You have unusual weakness or numbness in your arms or legs.   You develop nausea or vomiting.   You develop abdominal pain.   You feel faint.  Document Released: 08/30/2005 Document Revised: 08/19/2011 Document Reviewed: 01/18/2011 [facility] Patient Information 2012 [place], [place].Peripheral Edema You have swelling in your legs (peripheral edema). This  swelling is due to excess accumulation of salt and water in your body. Edema may be a sign of heart, kidney or liver disease, or a side effect of a medication. It may also be due to problems in the leg veins. Elevating your legs and using special support stockings may be very helpful, if the cause of the swelling is due to poor venous circulation. Avoid long periods of standing, whatever the cause. Treatment of edema depends on identifying the cause. Chips, pretzels, pickles and other salty foods should be avoided. Restricting salt in your diet is almost always needed. Water pills (diuretics) are often used to remove the excess salt and water from your body via urine. These medicines prevent the kidney from reabsorbing sodium. This increases urine flow. Diuretic treatment may also result in lowering of potassium levels in your body. Potassium supplements may be needed if you have to use diuretics daily. Daily weights can help you keep track of your progress in clearing your edema. You should call your caregiver for follow up care as recommended. SEEK IMMEDIATE MEDICAL CARE IF:   You have increased swelling, pain, redness, or heat in your legs.   You develop shortness of breath, especially when lying down.   You develop chest or abdominal pain, weakness, or fainting.   You have a fever.  Document Released: 10/07/2004 Document Revised: 08/19/2011 Document Reviewed: 09/17/2009 [facility] Patient Information 2012 [place], [place].

## 2012-03-12 NOTE — ED Provider Notes (Addendum)
BP 126/75  Pulse 82  Temp 98.3 F (36.8 C) (Oral)  Resp 19  Ht 5\' 2"  (1.575 m)  Wt 172 lb (78.019 kg)  BMI 31.46 kg/m2  SpO2 99%  LMP 02/16/2012  I saw and evaluated the patient, reviewed the resident's note and I agree with the findings and plan, ekg interpretation (reviewed by me). Pt c/o SOB only when pain in back severe. Has been present intermittently x 2 years, now not better with hot water packs/biofreeze/ultram prescribed by PMD. Seen for same 2 weeks ago. No SOB at this time. She does c/o R posterior/popliteal pain and subj leg swelling. No PE by CTA. No appreciable swelling by exam. +Popliteal ttp. +b/l upper thoracic/paraspinal ttp with muscle spasm, reproducible pain. Chest/back Pain constant > 6 hours with negative trop. EKG nondiag. No PE by CTA. Will treat for msk pain. She will receive lovenox in the ED and f/u for VUS RLE tomorrow morning. No EMC precluding discharge at this time. Given Precautions for return. PMD and U/S f/u.    Forbes Cellar, MD 03/12/12 1610  Forbes Cellar, MD 03/12/12 [phone]

## 2012-03-12 NOTE — ED Notes (Signed)
The patient denies any recent long distance travel.  She is a nonsmoker.  She denies use or oral contraceptives.  She denies any hx of blood clots.  She denies any increased level of exertion prior to the pain.

## 2012-03-12 NOTE — ED Provider Notes (Signed)
History     CSN: [id number]  Arrival date & time 03/12/12  1721   First MD Initiated Contact with Patient 03/12/12 1919      Chief Complaint  Patient presents with  . Shortness of Breath    Patient is a 46 y.o. female presenting with shortness of breath. The history is provided by the patient and the spouse. The history is limited by a language barrier. A language interpreter was used (Arabic).  Shortness of Breath  The current episode started 2 days ago. The onset was gradual. The problem occurs continuously. The problem has been gradually worsening. The problem is moderate. The symptoms are relieved by rest. The symptoms are aggravated by activity. Associated symptoms include cough (productive) and shortness of breath. Pertinent negatives include no chest pain, no fever and no wheezing.    Past Medical History  Diagnosis Date  . Diabetes mellitus   . Back pain   . Hypertension     Past Surgical History  Procedure Date  . Shoulder surgery     No family history on file.  History  Substance Use Topics  . Smoking status: Never Smoker   . Smokeless tobacco: Not on file  . Alcohol Use: No    OB History    Grav Para Term Preterm Abortions TAB SAB Ect Mult Living                  Review of Systems  Constitutional: Negative for fever, chills, activity change and appetite change.  HENT: Negative for neck pain and neck stiffness.   Respiratory: Positive for cough (productive) and shortness of breath. Negative for chest tightness and wheezing.   Cardiovascular: Negative for chest pain and palpitations.  Gastrointestinal: Negative for vomiting, abdominal pain, diarrhea and constipation.  Genitourinary: Negative for dysuria, decreased urine volume and difficulty urinating.  Musculoskeletal: Positive for myalgias (right upper back) and back pain (at site of swelling).  Skin: Negative for rash and wound.  Neurological: Negative for seizures, syncope and light-headedness.    Psychiatric/Behavioral: Negative for confusion and agitation.  All other systems reviewed and are negative.    Allergies  Metformin  Home Medications   Current Outpatient Rx  Name Route Sig Dispense Refill  . ALBUTEROL SULFATE HFA 108 (90 BASE) MCG/ACT IN AERS Inhalation Inhale 2 puffs into the lungs 4 (four) times daily.    Marland Kitchen AMITRIPTYLINE HCL 25 MG PO TABS Oral Take 25 mg by mouth at bedtime.    Marland Kitchen GLIMEPIRIDE 4 MG PO TABS Oral Take 4 mg by mouth daily before breakfast.    . LOSARTAN POTASSIUM 50 MG PO TABS Oral Take 50 mg by mouth daily.    . MELOXICAM 15 MG PO TABS Oral Take 15 mg by mouth daily.    Marland Kitchen MENTHOL (TOPICAL ANALGESIC) 4 % EX GEL Apply externally Apply 1 application topically daily as needed. For pain    . METHOCARBAMOL 500 MG PO TABS Oral Take 500 mg by mouth 2 (two) times daily.    Marland Kitchen SITAGLIPTIN PHOSPHATE 100 MG PO TABS Oral Take 100 mg by mouth daily.    . TRAMADOL HCL 50 MG PO TABS Oral Take 50 mg by mouth every 6 (six) hours as needed. For pain      BP 126/75  Pulse 82  Temp 98.3 F (36.8 C) (Oral)  Resp 19  Ht 5\' 2"  (1.575 m)  Wt 172 lb (78.019 kg)  BMI 31.46 kg/m2  SpO2 99%  LMP 02/16/2012  Physical  Exam  Nursing note and vitals reviewed. Constitutional: She is oriented to person, place, and time. She appears well-developed and well-nourished.       Appears anxious   HENT:  Head: Normocephalic and atraumatic.  Right Ear: External ear normal.  Left Ear: External ear normal.  Nose: Nose normal.  Mouth/Throat: Oropharynx is clear and moist. No oropharyngeal exudate.  Eyes: Conjunctivae are normal. Pupils are equal, round, and reactive to light.  Neck: Normal range of motion. Neck supple.  Cardiovascular: Normal rate, regular rhythm, normal heart sounds and intact distal pulses.  Exam reveals no gallop and no friction rub.   No murmur heard. Pulmonary/Chest: Effort normal and breath sounds normal. No respiratory distress. She has no wheezes. She has  no rales. She exhibits no tenderness.  Abdominal: Soft. Bowel sounds are normal. She exhibits no distension and no mass. There is no tenderness. There is no rebound and no guarding.  Musculoskeletal: Normal range of motion. She exhibits no edema and no tenderness.  Neurological: She is alert and oriented to person, place, and time.  Skin: Skin is warm and dry.       Edema overlying right upper back, tender to palpation  Psychiatric: She has a normal mood and affect. Her behavior is normal. Judgment and thought content normal.    ED Course  Procedures (including critical care time)  Labs Reviewed  CBC WITH DIFFERENTIAL - Abnormal; Notable for the following:    WBC 11.0 (*)     Hemoglobin 11.5 (*)     HCT 34.6 (*)     Neutro Abs 8.0 (*)     All other components within normal limits  BASIC METABOLIC PANEL - Abnormal; Notable for the following:    Glucose, Bld 346 (*)     GFR calc non Af Amer 75 (*)     GFR calc Af Amer 87 (*)     All other components within normal limits  TROPONIN I   Dg Chest 2 View  03/12/2012  *RADIOLOGY REPORT*  Clinical Data: Shortness of breath.  CHEST - 2 VIEW  Comparison: 01/06/2009  Findings: Heart and mediastinal contours are within normal limits. No focal opacities or effusions.  No acute bony abnormality.  IMPRESSION: No active cardiopulmonary disease.  Original Report Authenticated By: Cyndie Chime, M.D.   Ct Angio Chest W/cm &/or Wo Cm  03/12/2012  *RADIOLOGY REPORT*  Clinical Data: Pleuritic chest pain  CT ANGIOGRAPHY CHEST  Technique:  Multidetector CT imaging of the chest using the standard protocol during bolus administration of intravenous contrast. Multiplanar reconstructed images including MIPs were obtained and reviewed to evaluate the vascular anatomy.  Contrast: OMNIPAQUE IOHEXOL 350 MG/ML SOLN  Comparison: 03/12/2012 chest radiograph  Findings: No pulmonary arterial branch filling defect identified. Normal caliber aorta.  Heart size upper  normal to mildly enlarged. No pleural or pericardial effusion.  No intrathoracic lymphadenopathy.  Limited images through the upper abdomen show no acute abnormality.  Small filling defect along the dependent wall of the trachea likely reflects mucus or debris.  Small polyp considered less likely but not excluded.  There is respiratory motion which degrades detailed parenchymal evaluation.  Mild dependent atelectasis.  Otherwise, no focal consolidation.  No pneumothorax.  No acute osseous finding.  IMPRESSION: No pulmonary embolism or acute intrathoracic process identified.  Heart size upper normal to mildly enlarged.  Original Report Authenticated By: Waneta Martins, M.D.     1. Leg swelling   2. Back pain  Date: 03/12/2012  Rate: 82 bpm  Rhythm: normal sinus rhythm  QRS Axis: normal  Intervals: normal  ST/T Wave abnormalities: normal  Conduction Disutrbances:none  Narrative Interpretation: No evidence of acute ischemia or arrythmia  Old EKG Reviewed: unchanged (12/07/06)    MDM  46 yo F presents for 2 days of pleuritic back pain, dyspnea, and cough productive of yellow phlegm. She also states that she has had edema and pain of her right leg for 2 days. No history of trauma to back at site of pain. AFVSS. Mild edema of legs bilaterally and edema to upper right back compared with left back, which does appear to be tender to palpation. Patient has not been out of the country in 17 years. Due to dyspnea, pleuritic nature of pain, and recent right leg edema/pain, chest CTA obtained and negative for PE. Imaging also negative for evidence of pneumonia in this afebrile patient. However, because of leg edema, will prescribe Lovenox and have patient follow-up for outpatient DVT study. Patient given return precautions, including worsening of signs or symptoms. Patient instructed to follow-up with primary care physician.   Discussion through Arabic interpreter via phone.         Clemetine Marker, MD 03/12/12 2322

## 2012-03-13 ENCOUNTER — Ambulatory Visit (HOSPITAL_COMMUNITY)
Admit: 2012-03-13 | Discharge: 2012-03-13 | Disposition: A | Discharge status: Home / Self Care | Payer: Medicaid Other | Attending: Emergency Medicine | Admitting: Emergency Medicine

## 2012-03-13 ENCOUNTER — Emergency Department (HOSPITAL_COMMUNITY)
Admission: EM | Admit: 2012-03-13 | Discharge: 2012-03-13 | Disposition: A | Discharge status: Home / Self Care | Payer: Medicaid Other | Attending: Emergency Medicine | Admitting: Emergency Medicine

## 2012-03-13 DIAGNOSIS — Z79899 Other long term (current) drug therapy: Secondary | ICD-10-CM | POA: Insufficient documentation

## 2012-03-13 DIAGNOSIS — I10 Essential (primary) hypertension: Secondary | ICD-10-CM | POA: Insufficient documentation

## 2012-03-13 DIAGNOSIS — R079 Chest pain, unspecified: Secondary | ICD-10-CM | POA: Insufficient documentation

## 2012-03-13 DIAGNOSIS — R059 Cough, unspecified: Secondary | ICD-10-CM | POA: Insufficient documentation

## 2012-03-13 DIAGNOSIS — M79609 Pain in unspecified limb: Secondary | ICD-10-CM

## 2012-03-13 DIAGNOSIS — M7989 Other specified soft tissue disorders: Secondary | ICD-10-CM | POA: Insufficient documentation

## 2012-03-13 DIAGNOSIS — E119 Type 2 diabetes mellitus without complications: Secondary | ICD-10-CM | POA: Insufficient documentation

## 2012-03-13 DIAGNOSIS — M545 Low back pain: Secondary | ICD-10-CM

## 2012-03-13 DIAGNOSIS — R05 Cough: Secondary | ICD-10-CM | POA: Insufficient documentation

## 2012-03-13 DIAGNOSIS — M549 Dorsalgia, unspecified: Secondary | ICD-10-CM | POA: Insufficient documentation

## 2012-03-13 LAB — GLUCOSE, CAPILLARY: Glucose-Capillary: 243 mg/dL — ABNORMAL HIGH (ref 70–99)

## 2012-03-13 MED ORDER — ONDANSETRON HCL 4 MG/2ML IJ SOLN
INTRAMUSCULAR | Status: AC
  Filled 2012-03-13: qty 2

## 2012-03-13 NOTE — Progress Notes (Signed)
VASCULAR LAB PRELIMINARY  PRELIMINARY  PRELIMINARY  PRELIMINARY  Right lower extremity venous duplex completed.    Preliminary report: Right leg is negative for deep and superficial vein thrombosis.    Porschea Borys,  RVT 03/13/2012, 3:10 PM

## 2012-11-21 ENCOUNTER — Other Ambulatory Visit: Payer: Self-pay

## 2012-11-21 DIAGNOSIS — Z1231 Encounter for screening mammogram for malignant neoplasm of breast: Secondary | ICD-10-CM

## 2012-12-19 ENCOUNTER — Ambulatory Visit
Admission: RE | Admit: 2012-12-19 | Discharge: 2012-12-19 | Disposition: A | Discharge status: Home / Self Care | Payer: Medicaid Other | Source: Ambulatory Visit

## 2012-12-19 DIAGNOSIS — Z1231 Encounter for screening mammogram for malignant neoplasm of breast: Secondary | ICD-10-CM

## 2013-03-08 ENCOUNTER — Emergency Department (INDEPENDENT_AMBULATORY_CARE_PROVIDER_SITE_OTHER): Payer: Medicaid Other

## 2013-03-08 ENCOUNTER — Emergency Department (HOSPITAL_COMMUNITY)
Admission: EM | Admit: 2013-03-08 | Discharge: 2013-03-08 | Disposition: A | Discharge status: Home / Self Care | Payer: Medicaid Other | Source: Home / Self Care

## 2013-03-08 ENCOUNTER — Encounter (HOSPITAL_COMMUNITY): Payer: Self-pay | Admitting: Emergency Medicine

## 2013-03-08 DIAGNOSIS — M25519 Pain in unspecified shoulder: Secondary | ICD-10-CM

## 2013-03-08 DIAGNOSIS — S139XXA Sprain of joints and ligaments of unspecified parts of neck, initial encounter: Secondary | ICD-10-CM

## 2013-03-08 DIAGNOSIS — S86911A Strain of unspecified muscle(s) and tendon(s) at lower leg level, right leg, initial encounter: Secondary | ICD-10-CM

## 2013-03-08 DIAGNOSIS — M25511 Pain in right shoulder: Secondary | ICD-10-CM

## 2013-03-08 DIAGNOSIS — IMO0002 Reserved for concepts with insufficient information to code with codable children: Secondary | ICD-10-CM

## 2013-03-08 DIAGNOSIS — S161XXA Strain of muscle, fascia and tendon at neck level, initial encounter: Secondary | ICD-10-CM

## 2013-03-08 DIAGNOSIS — S93401A Sprain of unspecified ligament of right ankle, initial encounter: Secondary | ICD-10-CM

## 2013-03-08 DIAGNOSIS — S93409A Sprain of unspecified ligament of unspecified ankle, initial encounter: Secondary | ICD-10-CM

## 2013-03-08 MED ORDER — HYDROCODONE-ACETAMINOPHEN 7.5-325 MG PO TABS: 1.0000 | ORAL_TABLET | Freq: Four times a day (QID) | ORAL | Status: DC | PRN

## 2013-03-08 NOTE — ED Notes (Signed)
Pt reports slipping and falling on a wet floor today around 10:30 a.m, falling back with right leg folder behind her.  Pt is c/o right led, shoulder and neck pain. Pt has not taken any otc meds for pain relief.  Pt is alert and sitting up right with no acute signs of distress.

## 2013-03-08 NOTE — ED Notes (Signed)
History of surgery on the right shoulder.
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
## 2013-03-12 NOTE — ED Provider Notes (Signed)
Medical screening examination/treatment/procedure(s) were performed by non-physician practitioner and as supervising physician I was immediately available for consultation/collaboration.  Leslee Home, M.D.   Reuben Likes, MD 03/12/13 2129

## 2013-05-25 ENCOUNTER — Encounter: Payer: Medicaid Other | Admitting: Internal Medicine

## 2013-09-14 ENCOUNTER — Ambulatory Visit (HOSPITAL_COMMUNITY)
Admission: RE | Admit: 2013-09-14 | Discharge: 2013-09-14 | Disposition: A | Discharge status: Home / Self Care | Payer: Medicaid Other | Source: Ambulatory Visit | Attending: Internal Medicine | Admitting: Internal Medicine

## 2013-09-14 ENCOUNTER — Other Ambulatory Visit (HOSPITAL_COMMUNITY): Payer: Self-pay | Admitting: Internal Medicine

## 2013-09-14 DIAGNOSIS — M25559 Pain in unspecified hip: Secondary | ICD-10-CM | POA: Insufficient documentation

## 2013-09-14 DIAGNOSIS — M79609 Pain in unspecified limb: Secondary | ICD-10-CM

## 2013-09-14 NOTE — Progress Notes (Signed)
VASCULAR LAB PRELIMINARY  PRELIMINARY  PRELIMINARY  PRELIMINARY  Left lower extremity venous duplex completed.    Preliminary report:  Left:  No evidence of DVT, superficial thrombosis, or Baker's cyst.  Rudy Luhmann, RVT 09/14/2013, 5:14 PM

## 2014-02-05 ENCOUNTER — Emergency Department (HOSPITAL_COMMUNITY): Payer: Medicaid Other

## 2014-02-05 ENCOUNTER — Emergency Department (HOSPITAL_COMMUNITY)
Admission: EM | Admit: 2014-02-05 | Discharge: 2014-02-05 | Disposition: A | Discharge status: Home / Self Care | Payer: Medicaid Other | Attending: Emergency Medicine | Admitting: Emergency Medicine

## 2014-02-05 ENCOUNTER — Encounter (HOSPITAL_COMMUNITY): Payer: Self-pay | Admitting: Emergency Medicine

## 2014-02-05 DIAGNOSIS — M79609 Pain in unspecified limb: Secondary | ICD-10-CM | POA: Insufficient documentation

## 2014-02-05 DIAGNOSIS — R079 Chest pain, unspecified: Secondary | ICD-10-CM | POA: Insufficient documentation

## 2014-02-05 DIAGNOSIS — G8911 Acute pain due to trauma: Secondary | ICD-10-CM | POA: Insufficient documentation

## 2014-02-05 DIAGNOSIS — E119 Type 2 diabetes mellitus without complications: Secondary | ICD-10-CM | POA: Insufficient documentation

## 2014-02-05 DIAGNOSIS — Z79899 Other long term (current) drug therapy: Secondary | ICD-10-CM | POA: Insufficient documentation

## 2014-02-05 DIAGNOSIS — I10 Essential (primary) hypertension: Secondary | ICD-10-CM | POA: Insufficient documentation

## 2014-02-05 DIAGNOSIS — IMO0001 Reserved for inherently not codable concepts without codable children: Secondary | ICD-10-CM | POA: Insufficient documentation

## 2014-02-05 DIAGNOSIS — R42 Dizziness and giddiness: Secondary | ICD-10-CM | POA: Insufficient documentation

## 2014-02-05 DIAGNOSIS — M7918 Myalgia, other site: Secondary | ICD-10-CM

## 2014-02-05 DIAGNOSIS — M549 Dorsalgia, unspecified: Secondary | ICD-10-CM | POA: Insufficient documentation

## 2014-02-05 LAB — I-STAT CHEM 8, ED
BUN: 12 mg/dL (ref 6–23)
CALCIUM ION: 1.16 mmol/L (ref 1.12–1.23)
CREATININE: 0.9 mg/dL (ref 0.50–1.10)
Chloride: 102 mEq/L (ref 96–112)
GLUCOSE: 212 mg/dL — AB (ref 70–99)
HCT: 39 % (ref 36.0–46.0)
HEMOGLOBIN: 13.3 g/dL (ref 12.0–15.0)
Potassium: 4.3 mEq/L (ref 3.7–5.3)
SODIUM: 141 meq/L (ref 137–147)
TCO2: 25 mmol/L (ref 0–100)

## 2014-02-05 MED ORDER — HYDROCODONE-ACETAMINOPHEN 5-325 MG PO TABS: 1.0000 | ORAL_TABLET | Freq: Four times a day (QID) | ORAL | Status: DC | PRN

## 2014-02-05 MED ORDER — ONDANSETRON 4 MG PO TBDP
8.0000 mg | ORAL_TABLET | Freq: Once | ORAL | Status: AC
  Administered 2014-02-05: 8 mg via ORAL
  Filled 2014-02-05: qty 2

## 2014-02-05 MED ORDER — HYDROCODONE-ACETAMINOPHEN 5-325 MG PO TABS
2.0000 | ORAL_TABLET | Freq: Once | ORAL | Status: AC
  Administered 2014-02-05: 2 via ORAL
  Filled 2014-02-05: qty 2

## 2014-02-05 MED ORDER — ONDANSETRON HCL 4 MG PO TABS: 4.0000 mg | ORAL_TABLET | Freq: Four times a day (QID) | ORAL | Status: DC
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
## 2014-02-05 NOTE — ED Provider Notes (Signed)
CT scans and x-ray, reviewed all within normal limits.  Patient will be discharged home with a prescription for Norco and Zofran.  Arman Filter, NP 02/05/14 2153
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
## 2014-02-05 NOTE — ED Notes (Signed)
Pt ambulates with slow steady gait
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
## 2014-02-06 NOTE — ED Provider Notes (Signed)
Medical screening examination/treatment/procedure(s) were performed by non-physician practitioner and as supervising physician I was immediately available for consultation/collaboration.   EKG Interpretation None       Toy Baker, MD 02/06/14 1719

## 2014-02-06 NOTE — ED Provider Notes (Signed)
Medical screening examination/treatment/procedure(s) were conducted as a shared visit with non-physician practitioner(s) and myself.  I personally evaluated the patient during the encounter.   EKG Interpretation None       Toy Baker, MD 02/06/14 1719

## 2014-10-21 ENCOUNTER — Other Ambulatory Visit: Payer: Self-pay

## 2014-10-21 DIAGNOSIS — Z1231 Encounter for screening mammogram for malignant neoplasm of breast: Secondary | ICD-10-CM

## 2014-10-30 ENCOUNTER — Other Ambulatory Visit: Payer: Self-pay | Admitting: Internal Medicine

## 2014-10-30 ENCOUNTER — Ambulatory Visit
Admission: RE | Admit: 2014-10-30 | Discharge: 2014-10-30 | Disposition: A | Discharge status: Home / Self Care | Payer: Medicaid Other | Source: Ambulatory Visit

## 2014-10-30 DIAGNOSIS — R928 Other abnormal and inconclusive findings on diagnostic imaging of breast: Secondary | ICD-10-CM

## 2014-10-30 DIAGNOSIS — Z1231 Encounter for screening mammogram for malignant neoplasm of breast: Secondary | ICD-10-CM

## 2014-11-06 ENCOUNTER — Other Ambulatory Visit: Payer: Medicaid Other

## 2015-02-14 ENCOUNTER — Ambulatory Visit
Admission: RE | Admit: 2015-02-14 | Discharge: 2015-02-14 | Disposition: A | Discharge status: Home / Self Care | Payer: Medicaid Other | Source: Ambulatory Visit | Attending: Internal Medicine | Admitting: Internal Medicine

## 2015-02-14 ENCOUNTER — Other Ambulatory Visit: Payer: Self-pay | Admitting: Internal Medicine

## 2015-02-14 ENCOUNTER — Ambulatory Visit: Payer: Medicaid Other

## 2015-02-14 DIAGNOSIS — R928 Other abnormal and inconclusive findings on diagnostic imaging of breast: Secondary | ICD-10-CM

## 2015-05-13 ENCOUNTER — Emergency Department (HOSPITAL_COMMUNITY)
Admission: EM | Admit: 2015-05-13 | Discharge: 2015-05-13 | Disposition: A | Discharge status: Home / Self Care | Payer: Medicaid Other | Attending: Emergency Medicine | Admitting: Emergency Medicine

## 2015-05-13 ENCOUNTER — Encounter (HOSPITAL_COMMUNITY): Payer: Self-pay | Admitting: Emergency Medicine

## 2015-05-13 ENCOUNTER — Emergency Department (HOSPITAL_COMMUNITY): Payer: Medicaid Other

## 2015-05-13 DIAGNOSIS — R739 Hyperglycemia, unspecified: Secondary | ICD-10-CM

## 2015-05-13 DIAGNOSIS — Z79899 Other long term (current) drug therapy: Secondary | ICD-10-CM | POA: Diagnosis not present

## 2015-05-13 DIAGNOSIS — M7989 Other specified soft tissue disorders: Secondary | ICD-10-CM | POA: Insufficient documentation

## 2015-05-13 DIAGNOSIS — M549 Dorsalgia, unspecified: Secondary | ICD-10-CM | POA: Insufficient documentation

## 2015-05-13 DIAGNOSIS — G8929 Other chronic pain: Secondary | ICD-10-CM | POA: Diagnosis not present

## 2015-05-13 DIAGNOSIS — I10 Essential (primary) hypertension: Secondary | ICD-10-CM | POA: Insufficient documentation

## 2015-05-13 DIAGNOSIS — R079 Chest pain, unspecified: Secondary | ICD-10-CM

## 2015-05-13 DIAGNOSIS — E1165 Type 2 diabetes mellitus with hyperglycemia: Secondary | ICD-10-CM | POA: Diagnosis not present

## 2015-05-13 LAB — CBC WITH DIFFERENTIAL/PLATELET
BASOS PCT: 0 % (ref 0–1)
Basophils Absolute: 0 10*3/uL (ref 0.0–0.1)
EOS ABS: 0.1 10*3/uL (ref 0.0–0.7)
EOS PCT: 1 % (ref 0–5)
HCT: 39.6 % (ref 36.0–46.0)
Hemoglobin: 12.9 g/dL (ref 12.0–15.0)
Lymphocytes Relative: 25 % (ref 12–46)
Lymphs Abs: 2.3 10*3/uL (ref 0.7–4.0)
MCH: 25.5 pg — ABNORMAL LOW (ref 26.0–34.0)
MCHC: 32.6 g/dL (ref 30.0–36.0)
MCV: 78.3 fL (ref 78.0–100.0)
MONO ABS: 0.5 10*3/uL (ref 0.1–1.0)
MONOS PCT: 6 % (ref 3–12)
Neutro Abs: 6.5 10*3/uL (ref 1.7–7.7)
Neutrophils Relative %: 68 % (ref 43–77)
Platelets: 177 10*3/uL (ref 150–400)
RBC: 5.06 MIL/uL (ref 3.87–5.11)
RDW: 13.5 % (ref 11.5–15.5)
WBC: 9.5 10*3/uL (ref 4.0–10.5)

## 2015-05-13 LAB — BASIC METABOLIC PANEL
Anion gap: 10 (ref 5–15)
BUN: 10 mg/dL (ref 6–20)
CO2: 21 mmol/L — AB (ref 22–32)
CREATININE: 0.78 mg/dL (ref 0.44–1.00)
Calcium: 8.6 mg/dL — ABNORMAL LOW (ref 8.9–10.3)
Chloride: 106 mmol/L (ref 101–111)
GFR calc Af Amer: >60 mL/min (ref >60)
GFR calc non Af Amer: >60 mL/min (ref >60)
GLUCOSE: 268 mg/dL — AB (ref 65–99)
Potassium: 4 mmol/L (ref 3.5–5.1)
Sodium: 137 mmol/L (ref 135–145)

## 2015-05-13 LAB — TROPONIN I

## 2015-05-13 MED ORDER — TRAMADOL HCL 50 MG PO TABS: 50.0000 mg | ORAL_TABLET | Freq: Four times a day (QID) | ORAL | Status: DC | PRN

## 2015-05-13 MED ORDER — NITROGLYCERIN 0.4 MG SL SUBL
0.4000 mg | SUBLINGUAL_TABLET | SUBLINGUAL | Status: AC | PRN
  Administered 2015-05-13 (×3): 0.4 mg via SUBLINGUAL
  Filled 2015-05-13: qty 1

## 2015-05-13 MED ORDER — MORPHINE SULFATE (PF) 4 MG/ML IV SOLN
4.0000 mg | Freq: Once | INTRAVENOUS | Status: AC
  Administered 2015-05-13: 4 mg via INTRAVENOUS
  Filled 2015-05-13: qty 1

## 2015-05-13 MED ORDER — ONDANSETRON HCL 4 MG/2ML IJ SOLN
4.0000 mg | Freq: Once | INTRAMUSCULAR | Status: AC
  Administered 2015-05-13: 4 mg via INTRAVENOUS
  Filled 2015-05-13: qty 2

## 2015-05-13 NOTE — ED Provider Notes (Signed)
CSN: [id number]     Arrival date & time 05/13/15  1246 History  This chart was scribed for non-physician practitioner working with Lyndal Pulley, MD, by Jarvis Morgan, ED Scribe. This patient was seen in room TR11C/TR11C and the patient's care was started at 1:06 PM.     Chief Complaint  Patient presents with  . Back Pain   The history is provided by the patient. No language interpreter was used.    HPI Comments: Rachel Robertson is a 49 y.o. female with a h/o chronic back pain, DM and HTN who presents to the Emergency Department complaining of constant, sharp and pressured, chest pain onset 2 days. She states the pain radiates to her back. She notes the chest pain is exacerbated with deep inspiration. She reports associated dizziness, HA, nausea and swelling in her feet. She admits she took 3 doses of her pain medication yesterday with no relief. Pt is unable to tell us the name of the medication. Pt denies any injury. She denies any fever or chills.  PCP: Dorrene German, MD   Past Medical History  Diagnosis Date  . Diabetes mellitus   . Back pain   . Hypertension    Past Surgical History  Procedure Laterality Date  . Shoulder surgery     History reviewed. No pertinent family history. Social History  Substance Use Topics  . Smoking status: Never Smoker   . Smokeless tobacco: None  . Alcohol Use: No   OB History    No data available     Review of Systems  Constitutional: Negative for fever and chills.  Cardiovascular: Positive for chest pain and leg swelling.  Gastrointestinal: Positive for nausea.  Musculoskeletal: Positive for back pain.  Neurological: Positive for dizziness and headaches.  All other systems reviewed and are negative.     Allergies  Metformin  Home Medications   Prior to Admission medications   Medication Sig Start Date End Date Taking? Authorizing Provider  glimepiride (AMARYL) 4 MG tablet Take 4 mg by mouth daily before breakfast.    Historical  Provider, MD  HYDROcodone-acetaminophen (NORCO/VICODIN) 5-325 MG per tablet Take 1-2 tablets by mouth every 6 (six) hours as needed. 02/05/14   Junious Silk, PA-C  losartan (COZAAR) 50 MG tablet Take 50 mg by mouth daily.    Historical Provider, MD  methocarbamol (ROBAXIN) 500 MG tablet Take 500 mg by mouth 2 (two) times daily.    Historical Provider, MD  ondansetron (ZOFRAN) 4 MG tablet Take 1 tablet (4 mg total) by mouth every 6 (six) hours. 02/05/14   Junious Silk, PA-C  sitaGLIPtin (JANUVIA) 100 MG tablet Take 100 mg by mouth daily.    Historical Provider, MD  traMADol (ULTRAM) 50 MG tablet Take 50 mg by mouth every 6 (six) hours as needed. For pain    Historical Provider, MD  Vitamin D, Ergocalciferol, (DRISDOL) 50000 UNITS CAPS capsule Take 50,000 Units by mouth every 7 (seven) days. Every Monday    Historical Provider, MD   Triage Vitals: BP 189/62 mmHg  Pulse 76  Temp(Src) 97.8 F (36.6 C) (Oral)  Resp 14  Ht 5\' 4"  (1.626 m)  Wt 172 lb (78.019 kg)  BMI 29.51 kg/m2  SpO2 99%  LMP 02/16/2012  Physical Exam  Constitutional: She is oriented to person, place, and time. She appears well-developed and well-nourished. No distress.  HENT:  Head: Normocephalic and atraumatic.  Eyes: Conjunctivae and EOM are normal.  Neck: Neck supple. No tracheal deviation present.  Cardiovascular: Normal rate, regular rhythm and normal heart sounds.   Pulmonary/Chest: Effort normal and breath sounds normal. No respiratory distress. She exhibits tenderness (left).  Musculoskeletal: Normal range of motion.  No swelling of lower extremities  Neurological: She is alert and oriented to person, place, and time.  Skin: Skin is warm and dry.  Psychiatric: She has a normal mood and affect. Her behavior is normal.  Nursing note and vitals reviewed.   ED Course  Procedures (including critical care time)  DIAGNOSTIC STUDIES:   COORDINATION OF CARE:    Labs Review Labs Reviewed - No data to  display  Imaging Review No results found. I have personally reviewed and evaluated these images and lab results as part of my medical decision-making.  ED ECG REPORT   Date: 05/13/2015  Rate: 68  Rhythm: normal sinus rhythm  QRS Axis: normal  Intervals: normal  ST/T Wave abnormalities: normal  Conduction Disutrbances:none  Narrative Interpretation:   Old EKG Reviewed: none available  I have personally reviewed the EKG tracing and agree with the computerized printout as noted.   MDM   Final diagnoses:  Chest pain, unspecified chest pain type  Chronic back pain  Hyperglycemia    Pt c/o back pain still. Cp has completely resolved. Doubt acs. Pt given ultram and follow up with her pcp  I personally performed the services described in this documentation, which was scribed in my presence. The recorded information has been reviewed and is accurate.    Teressa Lower, NP 05/13/15 1605  Lyndal Pulley, MD 05/14/15 [phone]

## 2015-05-13 NOTE — ED Notes (Signed)
ON arrival to room T-11 Pt reports chest pain radiates to back for 2 days. Pt reports vomiting after taking meds. For pain. Pt did not bring that med  With her. Pt A/O and speaking in full sentences.

## 2015-05-13 NOTE — ED Notes (Signed)
Pt sts back pain worse than normal x 2 days; pt denies injury

## 2015-05-13 NOTE — Discharge Instructions (Signed)
Back Pain, Adult °Back pain is very common. The pain often gets better over time. The cause of back pain is usually not dangerous. Most people can learn to manage their back pain on their own.  °HOME CARE  °· Stay active. Start with short walks on flat ground if you can. Try to walk farther each day. °· Do not sit, drive, or stand in one place for more than 30 minutes. Do not stay in bed. °· Do not avoid exercise or work. Activity can help your back heal faster. °· Be careful when you bend or lift an object. Bend at your knees, keep the object close to you, and do not twist. °· Sleep on a firm mattress. Lie on your side, and bend your knees. If you lie on your back, put a pillow under your knees. °· Only take medicines as told by your doctor. °· Put ice on the injured area. °¨ Put ice in a plastic bag. °¨ Place a towel between your skin and the bag. °¨ Leave the ice on for 15-20 minutes, 03-04 times a day for the first 2 to 3 days. After that, you can switch between ice and heat packs. °· Ask your doctor about back exercises or massage. °· Avoid feeling anxious or stressed. Find good ways to deal with stress, such as exercise. °GET HELP RIGHT AWAY IF:  °· Your pain does not go away with rest or medicine. °· Your pain does not go away in 1 week. °· You have new problems. °· You do not feel well. °· The pain spreads into your legs. °· You cannot control when you poop (bowel movement) or pee (urinate). °· Your arms or legs feel weak or lose feeling (numbness). °· You feel sick to your stomach (nauseous) or throw up (vomit). °· You have belly (abdominal) pain. °· You feel like you may pass out (faint). °MAKE SURE YOU:  °· Understand these instructions. °· Will watch your condition. °· Will get help right away if you are not doing well or get worse. °Document Released: 02/16/2008 Document Revised: 11/22/2011 Document Reviewed: 01/01/2014 °ExitCare® Patient Information ©2015 [employer], [employer]. This information is not intended  to replace advice given to you by your health care provider. Make sure you discuss any questions you have with your health care provider. ° °

## 2015-05-14 ENCOUNTER — Ambulatory Visit: Payer: Medicaid Other

## 2015-05-14 ENCOUNTER — Ambulatory Visit (INDEPENDENT_AMBULATORY_CARE_PROVIDER_SITE_OTHER): Payer: Medicaid Other | Admitting: Podiatry

## 2015-05-14 ENCOUNTER — Encounter: Payer: Self-pay | Admitting: Podiatry

## 2015-05-14 VITALS — BP 147/84 | HR 77 | Resp 12

## 2015-05-14 DIAGNOSIS — M79662 Pain in left lower leg: Secondary | ICD-10-CM | POA: Diagnosis not present

## 2015-05-14 DIAGNOSIS — R52 Pain, unspecified: Secondary | ICD-10-CM

## 2015-05-14 DIAGNOSIS — R0989 Other specified symptoms and signs involving the circulatory and respiratory systems: Secondary | ICD-10-CM

## 2015-05-14 NOTE — Addendum Note (Signed)
Addended by: Shelly Bombard on: 05/14/2015 01:23 PM   Modules accepted: Orders

## 2015-05-14 NOTE — Progress Notes (Signed)
   Subjective:    Patient ID: Rachel Robertson, female    DOB: Nov 03, 1965, 49 y.o.   MRN: [id number]  HPI N-PAIN L-RT TOP FOOT AND BACK OF THE LEG D-3 YEARS O-SLOWLY C-WORSE A-SITTING/STANDING T-NONE  This patient presents today with her daughter is present in the treatment room who speaks and translates for her mother. The mother is from [place]. There is a 2 -3 year history of some posterior left calf pain that occurs on and off weightbearing and is not increase in pain. There is no history of evaluation for this problem. Also patient is complaining of pain in the dorsal aspect of the right foot over several years aggravated with standing and walking relieved with rest. There is no history direct injury. There is no history of self treatment or professional treatment for right foot pain  Patient is a diabetic for approximately 18 years  Review of Systems  Eyes: Positive for itching.  Respiratory: Positive for cough.   Cardiovascular: Positive for leg swelling.  Musculoskeletal: Positive for back pain and gait problem.  Neurological: Positive for dizziness, numbness and headaches.   Patient denies any history of ulceration. As a history of some cramping in the left calf with walking well is sitting and sleeping    Objective:   Physical Exam  Patient appears orientated 3 with her daughter present in the treatment room speaks and translates for her in her native language from [place]  Vascular: No peripheral edema noted bilaterally Calf tenderness to palpation left without any edema Slight calf tenderness right without any edema DP pulses 2/4 bilaterally PT pulses 0/4 bilaterally  Neurological: Sensation to 10 g monofilament wire intact 5/5 bilaterally Vibratory sensation reactive bilaterally Ankle reflex equal and reactive bilaterally  Dermatological: Texture and turgor within normal limits bilaterally No skin lesions are noted bilaterally  Musculoskeletal: No deformities are  noted bilaterally There is no restriction ankle, subtalar, midtarsal joints bilaterally      Assessment & Plan:   Assessment: Decreased posterior tibial pulses bilaterally History of left calf pain and cramping  Plan: I reviewed the results of the examination with patient and her daughter today and F size that I wanted to first evaluate her vascular status and was going to refer to the vascular lab for arterial Dopplers and venous Dopplers. I'll return her after results of these labs are available  At this time I want to evaluate patient's vascular status in particular her arterial status bilaterally and rule out a DVT left  Bilateral lower extremity arterial Doppler including ABIs and TBI is ordered for the indication of decreased posterior tibial pulses bilaterally and diabetes Venous Doppler lower extremity left ordered for the indication of calf pain and cramping

## 2015-05-14 NOTE — Patient Instructions (Signed)
Today I did a diabetic foot screen with a history of left calf cramping Your examination revealed decreased pedal pulses I'm referring you to the vascular lab for lower extremity arterial Doppler and TBI's for the indication of decreased pedal pulses and diabetes Lower extremity venous Doppler left for the indication of calf pain left  Our office will contact you with the results of the vascular examinations  Diabetes and Foot Care Diabetes may cause you to have problems because of poor blood supply (circulation) to your feet and legs. This may cause the skin on your feet to become thinner, break easier, and heal more slowly. Your skin may become dry, and the skin may peel and crack. You may also have nerve damage in your legs and feet causing decreased feeling in them. You may not notice minor injuries to your feet that could lead to infections or more serious problems. Taking care of your feet is one of the most important things you can do for yourself.  HOME CARE INSTRUCTIONS  Wear shoes at all times, even in the house. Do not go barefoot. Bare feet are easily injured.  Check your feet daily for blisters, cuts, and redness. If you cannot see the bottom of your feet, use a mirror or ask someone for help.  Wash your feet with warm water (do not use hot water) and mild soap. Then pat your feet and the areas between your toes until they are completely dry. Do not soak your feet as this can dry your skin.  Apply a moisturizing lotion or petroleum jelly (that does not contain alcohol and is unscented) to the skin on your feet and to dry, brittle toenails. Do not apply lotion between your toes.  Trim your toenails straight across. Do not dig under them or around the cuticle. File the edges of your nails with an emery board or nail file.  Do not cut corns or calluses or try to remove them with medicine.  Wear clean socks or stockings every day. Make sure they are not too tight. Do not wear  knee-high stockings since they may decrease blood flow to your legs.  Wear shoes that fit properly and have enough cushioning. To break in new shoes, wear them for just a few hours a day. This prevents you from injuring your feet. Always look in your shoes before you put them on to be sure there are no objects inside.  Do not cross your legs. This may decrease the blood flow to your feet.  If you find a minor scrape, cut, or break in the skin on your feet, keep it and the skin around it clean and dry. These areas may be cleansed with mild soap and water. Do not cleanse the area with peroxide, alcohol, or iodine.  When you remove an adhesive bandage, be sure not to damage the skin around it.  If you have a wound, look at it several times a day to make sure it is healing.  Do not use heating pads or hot water bottles. They may burn your skin. If you have lost feeling in your feet or legs, you may not know it is happening until it is too late.  Make sure your health care provider performs a complete foot exam at least annually or more often if you have foot problems. Report any cuts, sores, or bruises to your health care provider immediately. SEEK MEDICAL CARE IF:   You have an injury that is not healing.  You have cuts or breaks in the skin.  You have an ingrown nail.  You notice redness on your legs or feet.  You feel burning or tingling in your legs or feet.  You have pain or cramps in your legs and feet.  Your legs or feet are numb.  Your feet always feel cold. SEEK IMMEDIATE MEDICAL CARE IF:   There is increasing redness, swelling, or pain in or around a wound.  There is a red line that goes up your leg.  Pus is coming from a wound.  You develop a fever or as directed by your health care provider.  You notice a bad smell coming from an ulcer or wound. Document Released: 08/27/2000 Document Revised: 05/02/2013 Document Reviewed: 02/06/2013 [facility] Patient Information  2015 [place], [place]. This information is not intended to replace advice given to you by your health care provider. Make sure you discuss any questions you have with your health care provider.

## 2015-05-15 ENCOUNTER — Telehealth: Payer: Self-pay | Admitting: *Deleted

## 2015-05-15 DIAGNOSIS — I82402 Acute embolism and thrombosis of unspecified deep veins of left lower extremity: Secondary | ICD-10-CM

## 2015-05-15 NOTE — Telephone Encounter (Addendum)
ABI doppler [id number], dx R09.89 approved for B/L - case# [id number], pre-certification Authorization# [id number].  Lower extremity venous doppler [id number], dx DVT - case# [id number], pre-certification Authorization# [id number].  Both test pre-cert expire 13/24/4010.  Both faxed to [phone].  Unable to contact pt's family member to give appt information  - line busy.  Unable to contact pt's family member to give information to schedule appts.  Left message informing the family member pt had been approved to have the left venous doppler, B/L ABI, call to make an appt [phone], and to call our office with questions.

## 2015-06-02 ENCOUNTER — Other Ambulatory Visit: Payer: Self-pay | Admitting: Podiatry

## 2015-06-02 DIAGNOSIS — M79662 Pain in left lower leg: Secondary | ICD-10-CM

## 2015-06-09 ENCOUNTER — Inpatient Hospital Stay (HOSPITAL_COMMUNITY): Admission: RE | Admit: 2015-06-09 | Payer: Medicaid Other | Source: Ambulatory Visit

## 2015-06-09 ENCOUNTER — Telehealth: Payer: Self-pay | Admitting: Cardiovascular Disease

## 2015-06-09 NOTE — Telephone Encounter (Addendum)
Received referral packet from [facility] on patient for  Upcoming appointment on 06/16/2015 with Dr. Duke Salvia.  Records given to [facility]  cbr

## 2015-06-15 ENCOUNTER — Encounter: Payer: Self-pay | Admitting: Cardiovascular Disease

## 2015-06-15 DIAGNOSIS — I10 Essential (primary) hypertension: Secondary | ICD-10-CM

## 2015-06-15 HISTORY — DX: Essential (primary) hypertension: I10

## 2015-06-15 NOTE — Progress Notes (Signed)
Cardiology Office Note   Date:  06/16/2015   ID:  Rachel Robertson, DOB [birth year], MRN [id number]  PCP:  Dorrene German, MD  Cardiologist:   Madilyn Hook, MD   Chief Complaint  Patient presents with  . New Evaluation    pt c/o dizziness and chest and back pain      History of Present Illness: Rachel Robertson is a 49 y.o. female with diabetes, hypertension, who presents for an evaluation of dizziness, chest and back pain.  She is from [place] and speaks Somali.  She is accompanied by her son and this interview was provided with the assistance of an interpreter (Pashir [phone]).  Rachel Robertson reports 15 or 16 years of constant chest and back pain. The pain is 8-9 out of 10 in severity. It's been getting increasingly worse over the last 2 months. She endorses associated shortness of breath. She's been using heat and ice as well as massage without improvement. The symptoms are not exertional and are not associated with nausea, vomiting, or diaphoresis.    Rachel Robertson also reports episode of dizziness that it began occurring every 2 hours for the last 2 years. She especially notices it when she walks on a treadmill or when exerting herself. She's had a fall while walking on treadmill and this is limited her desire to exercise. She also notes it when changing positions and has started to weight when changing positions in order to avoid this. This seems to be helping somewhat. She endorses some lower extremity edema that improves with elevation. She has some shortness of breath when laying on her left side but denies classic orthopnea or PND. She reports a 7 pound weight loss in the last month that was unintentional. She noticed this when she followed up with her primary care provider. She eats a healthy diet and is working on portion control. She eats lots of vegetables and fruits.  Of note, Rachel Robertson did not take her blood pressure medicine yet today.  Past Medical History  Diagnosis Date  .  Diabetes mellitus   . Back pain   . Hypertension   . Essential hypertension 06/15/2015    Past Surgical History  Procedure Laterality Date  . Shoulder surgery       Current Outpatient Prescriptions  Medication Sig Dispense Refill  . glimepiride (AMARYL) 4 MG tablet Take 4 mg by mouth 2 (two) times daily.    Marland Kitchen HYDROcodone-acetaminophen (NORCO/VICODIN) 5-325 MG per tablet Take 1-2 tablets by mouth every 6 (six) hours as needed. 12 tablet 0  . losartan (COZAAR) 50 MG tablet Take 50 mg by mouth daily.    . metFORMIN (GLUCOPHAGE) 500 MG tablet Take 500 mg by mouth 2 (two) times daily with a meal.    . methocarbamol (ROBAXIN) 500 MG tablet Take 500 mg by mouth 2 (two) times daily.    Marland Kitchen omeprazole (PRILOSEC) 20 MG capsule Take 20 mg by mouth daily.    . ondansetron (ZOFRAN) 4 MG tablet Take 1 tablet (4 mg total) by mouth every 6 (six) hours. 12 tablet 0  . PROAIR HFA 108 (90 BASE) MCG/ACT inhaler   0  . sitaGLIPtin (JANUVIA) 100 MG tablet Take 100 mg by mouth daily.    Marland Kitchen tiZANidine (ZANAFLEX) 4 MG tablet Take 4 mg by mouth every 6 (six) hours as needed for muscle spasms.    . traMADol (ULTRAM) 50 MG tablet Take 50 mg by mouth every 6 (six) hours as needed.  For pain    . Vitamin D, Ergocalciferol, (DRISDOL) 50000 UNITS CAPS capsule Take 50,000 Units by mouth every 7 (seven) days. Every Monday    . [DISCONTINUED] enoxaparin (LOVENOX) 30 MG/0.3ML injection Inject 0.3 mLs (30 mg total) into the skin every 12 (twelve) hours. 2 Syringe 0   No current facility-administered medications for this visit.    Allergies:   Metformin    Social History:  The patient  reports that she has never smoked. She does not have any smokeless tobacco history on file. She reports that she does not drink alcohol or use illicit drugs.   Family History:  The patient's family history includes Diabetes in her maternal grandfather, maternal grandmother, mother, paternal grandfather, paternal grandmother, and sister;  Hypertension in her maternal grandfather, maternal grandmother, mother, paternal grandfather, paternal grandmother, and sister.    ROS:  Please see the history of present illness.   Otherwise, review of systems are positive for none.   All other systems are reviewed and negative.    PHYSICAL EXAM: VS:  BP 180/100 mmHg  Pulse 68  Ht  (1.626 m)  Wt 79.606 kg (175 lb 8 oz)  BMI 30.11 kg/m2  LMP 02/16/2012 , BMI Body mass index is 30.11 kg/(m^2). GENERAL:  Well appearing HEENT:  Pupils equal round and reactive, fundi not visualized, oral mucosa unremarkable NECK:  No jugular venous distention, waveform within normal limits, carotid upstroke brisk and symmetric, no bruits, no thyromegaly LYMPHATICS:  No cervical adenopathy LUNGS:  Clear to auscultation bilaterally HEART:  RRR.  PMI not displaced or sustained,S1 and S2 within normal limits, no S3, no S4, no clicks, no rubs, no murmurs ABD:  Flat, positive bowel sounds normal in frequency in pitch, no bruits, no rebound, no guarding, no midline pulsatile mass, no hepatomegaly, no splenomegaly EXT:  2 plus pulses throughout, no edema, no cyanosis no clubbing SKIN:  No rashes no nodules NEURO:  Cranial nerves II through XII grossly intact, motor grossly intact throughout PSYCH:  Cognitively intact, oriented to person place and time    EKG:  EKG is ordered today. The ekg ordered today demonstrates sinus rhythm at 68 bpm.   Recent Labs: 05/13/2015: BUN 10; Creatinine, Ser 0.78; Hemoglobin 12.9; Platelets 177; Potassium 4.0; Sodium 137    Lipid Panel    Component Value Date/Time   CHOL 140 09/02/2010 2129   TRIG 81 09/02/2010 2129   HDL 52 09/02/2010 2129   CHOLHDL 2.7 Ratio 09/02/2010 2129   VLDL 16 09/02/2010 2129   LDLCALC 72 09/02/2010 2129      Wt Readings from Last 3 Encounters:  06/16/15 79.606 kg (175 lb 8 oz)  05/13/15 78.019 kg (172 lb)  03/12/12 78.019 kg (172 lb)      Other studies Reviewed: Additional  studies/ records that were reviewed today include: . Review of the above records demonstrates:  Please see elsewhere in the note.     ASSESSMENT AND PLAN:  # Chest pain: Rachel Robertson symptoms are very atypical in that they have been constant for the last 15 or 16 years and are not exertional. She does have multiple risk factors including diabetes and hypertension. We will obtain a Lexiscan Myoview in order to rule out ischemia.    # Dizziness: Her symptoms could be related to carotid disease or vertebrobasilar insufficiency.  She denies palpitaitons that would be concerning for primary arrhythmia. - Carotid ultrasound - Echo - TSH, CMP, CBC  # Hypertension: BP is poorly controlled today.  This is likely due to not taking her medication today.  On review of her vitals it has typically been well-controlled and she states that he has been well-controlled as well. Therefore we will not make any changes at this time.  # CV Disease Prevention: She is not on a statin, though she is diabetic.  We will check lipids and a CMP.  Current medicines are reviewed at length with the patient today.  The patient does not have concerns regarding medicines.  The following changes have been made:  no change  Labs/ tests ordered today include:  No orders of the defined types were placed in this encounter.     Disposition:   FU with Jayin Derousse C. Duke Salvia, MD in 6 months.    Signed, Madilyn Hook, MD  06/16/2015 11:26 AM    [facility]

## 2015-06-16 ENCOUNTER — Ambulatory Visit (INDEPENDENT_AMBULATORY_CARE_PROVIDER_SITE_OTHER): Payer: Medicaid Other | Admitting: Cardiovascular Disease

## 2015-06-16 ENCOUNTER — Encounter: Payer: Self-pay | Admitting: Cardiovascular Disease

## 2015-06-16 VITALS — BP 180/100 | HR 68 | Ht 64.0 in | Wt 175.5 lb

## 2015-06-16 DIAGNOSIS — Z79899 Other long term (current) drug therapy: Secondary | ICD-10-CM

## 2015-06-16 DIAGNOSIS — R079 Chest pain, unspecified: Secondary | ICD-10-CM | POA: Diagnosis not present

## 2015-06-16 DIAGNOSIS — R42 Dizziness and giddiness: Secondary | ICD-10-CM

## 2015-06-16 DIAGNOSIS — I10 Essential (primary) hypertension: Secondary | ICD-10-CM

## 2015-06-16 DIAGNOSIS — R0602 Shortness of breath: Secondary | ICD-10-CM | POA: Diagnosis not present

## 2015-06-16 DIAGNOSIS — E785 Hyperlipidemia, unspecified: Secondary | ICD-10-CM

## 2015-06-16 NOTE — Patient Instructions (Addendum)
Medication Instructions:  Dr Duke Salvia has made no changes in your current medications or treatment plan.  Labwork: Your physician recommends that you return for lab work at your earliest convenience - FASTING.  Testing/Procedures: Your physician has requested that you have an echocardiogram. Echocardiography is a painless test that uses sound waves to create images of your heart. It provides your doctor with information about the size and shape of your heart and how well your heart's chambers and valves are working. This procedure takes approximately one hour. There are no restrictions for this procedure.  Your physician has requested that you have a lexiscan myoview. For further information please visit [email or web address] Please follow instruction sheet, as given.  These tests will be done at our [facility] location. The address is [address], [address]. The phone number is [phone] if you have any concerns or need directions.  Follow-Up: Dr Duke Salvia recommends that you schedule a follow-up appointment in 6 months. You will receive a reminder letter in the mail two months in advance. If you don't receive a letter, please call our office to schedule the follow-up appointment.

## 2015-06-18 LAB — COMPREHENSIVE METABOLIC PANEL
ALBUMIN: 4 g/dL (ref 3.6–5.1)
ALT: 19 U/L (ref 6–29)
AST: 16 U/L (ref 10–35)
Alkaline Phosphatase: 83 U/L (ref 33–130)
BILIRUBIN TOTAL: 0.6 mg/dL (ref 0.2–1.2)
BUN: 10 mg/dL (ref 7–25)
CHLORIDE: 105 mmol/L (ref 98–110)
CO2: 27 mmol/L (ref 20–31)
Calcium: 8.8 mg/dL (ref 8.6–10.4)
Creat: 0.77 mg/dL (ref 0.50–1.05)
Glucose, Bld: 122 mg/dL — ABNORMAL HIGH (ref 65–99)
Potassium: 4.6 mmol/L (ref 3.5–5.3)
Sodium: 142 mmol/L (ref 135–146)
TOTAL PROTEIN: 6.9 g/dL (ref 6.1–8.1)

## 2015-06-18 LAB — CBC
HCT: 37.6 % (ref 36.0–46.0)
Hemoglobin: 12.9 g/dL (ref 12.0–15.0)
MCH: 25.6 pg — ABNORMAL LOW (ref 26.0–34.0)
MCHC: 34.3 g/dL (ref 30.0–36.0)
MCV: 74.6 fL — ABNORMAL LOW (ref 78.0–100.0)
MPV: 10.8 fL (ref 8.6–12.4)
Platelets: 202 10*3/uL (ref 150–400)
RBC: 5.04 MIL/uL (ref 3.87–5.11)
RDW: 13.8 % (ref 11.5–15.5)
WBC: 8.4 10*3/uL (ref 4.0–10.5)

## 2015-06-18 LAB — LIPID PANEL
CHOL/HDL RATIO: 3.3 ratio (ref <=5.0)
CHOLESTEROL: 147 mg/dL (ref 125–200)
HDL: 45 mg/dL — ABNORMAL LOW (ref >=46)
LDL Cholesterol: 83 mg/dL (ref <130)
Triglycerides: 97 mg/dL (ref <150)
VLDL: 19 mg/dL (ref <30)

## 2015-06-18 LAB — TSH: TSH: 2.208 u[IU]/mL (ref 0.350–4.500)

## 2015-06-20 ENCOUNTER — Ambulatory Visit (HOSPITAL_COMMUNITY)
Admission: RE | Admit: 2015-06-20 | Discharge: 2015-06-20 | Disposition: A | Discharge status: Home / Self Care | Payer: Medicaid Other | Source: Ambulatory Visit | Attending: Cardiovascular Disease | Admitting: Cardiovascular Disease

## 2015-06-20 DIAGNOSIS — I6523 Occlusion and stenosis of bilateral carotid arteries: Secondary | ICD-10-CM | POA: Diagnosis not present

## 2015-06-20 DIAGNOSIS — E785 Hyperlipidemia, unspecified: Secondary | ICD-10-CM | POA: Insufficient documentation

## 2015-06-20 DIAGNOSIS — I10 Essential (primary) hypertension: Secondary | ICD-10-CM | POA: Insufficient documentation

## 2015-06-20 DIAGNOSIS — R0602 Shortness of breath: Secondary | ICD-10-CM

## 2015-06-20 DIAGNOSIS — R42 Dizziness and giddiness: Secondary | ICD-10-CM

## 2015-06-20 DIAGNOSIS — E119 Type 2 diabetes mellitus without complications: Secondary | ICD-10-CM | POA: Diagnosis not present

## 2015-06-22 ENCOUNTER — Encounter: Payer: Self-pay | Admitting: Cardiovascular Disease

## 2015-06-22 DIAGNOSIS — I6529 Occlusion and stenosis of unspecified carotid artery: Secondary | ICD-10-CM

## 2015-06-22 HISTORY — DX: Occlusion and stenosis of unspecified carotid artery: I65.29

## 2015-06-23 ENCOUNTER — Telehealth (HOSPITAL_COMMUNITY): Payer: Self-pay

## 2015-06-23 ENCOUNTER — Telehealth: Payer: Self-pay | Admitting: *Deleted

## 2015-06-23 MED ORDER — ASPIRIN EC 81 MG PO TBEC: 81.0000 mg | DELAYED_RELEASE_TABLET | Freq: Every day | ORAL | Status: DC

## 2015-06-23 NOTE — Telephone Encounter (Signed)
Spoke to husband He states she speaks very little english. Results given  Labs and carotid dopplers.  aware to start asa 81 mg daily.

## 2015-06-23 NOTE — Telephone Encounter (Signed)
-----   Message from Chilton Si, MD sent at 06/22/2015 10:36 AM EDT ----- Cholesterol levels are OK.  Work on increasing exercise and making sure that her blood pressure is under control.  Avoid fatty foods.

## 2015-06-23 NOTE — Telephone Encounter (Signed)
Patient notified using the Telephone Interpreting Phone Line ( interpreter [facility] # [phone]). The patient gave permission for me and the interpreter to speak to her husband. Patient's husband given detailed instructions per Myocardial Perfusion Study Information Sheet for test on 06-25-2015 at 0745. Patient's husband notified for the patient to arrive 15 minutes early and that it is imperative to arrive on time for appointment to keep from having the test rescheduled. Patient's husband verbalized understanding. Randa Evens, Marquette Piontek A

## 2015-06-23 NOTE — Telephone Encounter (Signed)
-----   Message from Chilton Si, MD sent at 06/22/2015 10:44 AM EDT ----- Mild blockages in bilateral carotid arteries.  Recommend starting aspirin 81 mg daily.

## 2015-06-25 ENCOUNTER — Encounter (HOSPITAL_COMMUNITY): Payer: Self-pay

## 2015-06-25 ENCOUNTER — Other Ambulatory Visit: Payer: Self-pay

## 2015-06-25 ENCOUNTER — Ambulatory Visit (HOSPITAL_COMMUNITY): Payer: Medicaid Other | Attending: Cardiovascular Disease

## 2015-06-25 ENCOUNTER — Ambulatory Visit (HOSPITAL_BASED_OUTPATIENT_CLINIC_OR_DEPARTMENT_OTHER): Payer: Medicaid Other

## 2015-06-25 DIAGNOSIS — I10 Essential (primary) hypertension: Secondary | ICD-10-CM | POA: Insufficient documentation

## 2015-06-25 DIAGNOSIS — Z8249 Family history of ischemic heart disease and other diseases of the circulatory system: Secondary | ICD-10-CM | POA: Diagnosis not present

## 2015-06-25 DIAGNOSIS — R0602 Shortness of breath: Secondary | ICD-10-CM

## 2015-06-25 DIAGNOSIS — I517 Cardiomegaly: Secondary | ICD-10-CM | POA: Insufficient documentation

## 2015-06-25 DIAGNOSIS — I340 Nonrheumatic mitral (valve) insufficiency: Secondary | ICD-10-CM | POA: Insufficient documentation

## 2015-06-25 DIAGNOSIS — R42 Dizziness and giddiness: Secondary | ICD-10-CM

## 2015-06-25 DIAGNOSIS — E119 Type 2 diabetes mellitus without complications: Secondary | ICD-10-CM | POA: Diagnosis not present

## 2015-06-25 DIAGNOSIS — I071 Rheumatic tricuspid insufficiency: Secondary | ICD-10-CM | POA: Diagnosis not present

## 2015-06-25 DIAGNOSIS — R0609 Other forms of dyspnea: Secondary | ICD-10-CM | POA: Diagnosis not present

## 2015-06-25 DIAGNOSIS — R079 Chest pain, unspecified: Secondary | ICD-10-CM | POA: Diagnosis not present

## 2015-06-25 LAB — MYOCARDIAL PERFUSION IMAGING
CHL CUP NUCLEAR SRS: 1
CHL CUP NUCLEAR SSS: 2
CSEPPHR: 115 {beats}/min
LV dias vol: 53 mL
LVSYSVOL: 16 mL
NUC STRESS TID: 0.97
RATE: 0.28
Rest HR: 74 {beats}/min
SDS: 1

## 2015-06-25 MED ORDER — TECHNETIUM TC 99M SESTAMIBI GENERIC - CARDIOLITE
32.1000 | Freq: Once | INTRAVENOUS | Status: AC | PRN
  Administered 2015-06-25: 32.1 via INTRAVENOUS

## 2015-06-25 MED ORDER — REGADENOSON 0.4 MG/5ML IV SOLN
0.4000 mg | Freq: Once | INTRAVENOUS | Status: AC
  Administered 2015-06-25: 0.4 mg via INTRAVENOUS

## 2015-06-25 MED ORDER — TECHNETIUM TC 99M SESTAMIBI GENERIC - CARDIOLITE
10.4000 | Freq: Once | INTRAVENOUS | Status: AC | PRN
  Administered 2015-06-25: 10 via INTRAVENOUS

## 2015-06-25 NOTE — Progress Notes (Signed)
Rachel Robertson (DOB:[birth year]) here for a [employer] Corporation[employer]iscan Cardiolite. The patient speaks Somali. Telephone Interpreter line used. I spoke with Carloyn JaegerHalina , [phone] for the patient to sign a release to allow her husband to interpret. Randa EvensEdwards, Sally-Ann Cutbirth A

## 2015-06-26 ENCOUNTER — Telehealth: Payer: Self-pay | Admitting: *Deleted

## 2015-06-26 NOTE — Telephone Encounter (Signed)
Spoke to patient's husband.  Both Result given . Verbalized understanding

## 2015-06-26 NOTE — Telephone Encounter (Signed)
-----   Message from Chilton Siiffany Canyon Day, MD sent at 06/25/2015  6:54 PM EDT ----- Heart muscle is mildly thickened and slightly stiff.  It will be important to control her blood pressure to prevent this from getting worse.

## 2015-06-26 NOTE — Telephone Encounter (Signed)
-----   Message from Chilton Siiffany Culloden, MD sent at 06/25/2015  6:54 PM EDT ----- Normal stress test.

## 2015-07-01 ENCOUNTER — Encounter (HOSPITAL_COMMUNITY): Payer: Self-pay | Admitting: Emergency Medicine

## 2015-07-01 ENCOUNTER — Emergency Department (HOSPITAL_COMMUNITY): Payer: Medicaid Other

## 2015-07-01 ENCOUNTER — Emergency Department (HOSPITAL_COMMUNITY)
Admission: EM | Admit: 2015-07-01 | Discharge: 2015-07-01 | Disposition: A | Discharge status: Home / Self Care | Payer: Medicaid Other | Attending: Emergency Medicine | Admitting: Emergency Medicine

## 2015-07-01 DIAGNOSIS — M546 Pain in thoracic spine: Secondary | ICD-10-CM | POA: Insufficient documentation

## 2015-07-01 DIAGNOSIS — I10 Essential (primary) hypertension: Secondary | ICD-10-CM | POA: Diagnosis not present

## 2015-07-01 DIAGNOSIS — R0602 Shortness of breath: Secondary | ICD-10-CM | POA: Diagnosis not present

## 2015-07-01 DIAGNOSIS — Z79899 Other long term (current) drug therapy: Secondary | ICD-10-CM | POA: Insufficient documentation

## 2015-07-01 DIAGNOSIS — Z7982 Long term (current) use of aspirin: Secondary | ICD-10-CM | POA: Insufficient documentation

## 2015-07-01 DIAGNOSIS — E119 Type 2 diabetes mellitus without complications: Secondary | ICD-10-CM | POA: Insufficient documentation

## 2015-07-01 DIAGNOSIS — M542 Cervicalgia: Secondary | ICD-10-CM | POA: Diagnosis not present

## 2015-07-01 LAB — CBC WITH DIFFERENTIAL/PLATELET
BASOS ABS: 0.1 10*3/uL (ref 0.0–0.1)
BASOS PCT: 0 %
EOS ABS: 0.1 10*3/uL (ref 0.0–0.7)
EOS PCT: 1 %
HCT: 38.5 % (ref 36.0–46.0)
Hemoglobin: 12.5 g/dL (ref 12.0–15.0)
Lymphocytes Relative: 22 %
Lymphs Abs: 2.6 10*3/uL (ref 0.7–4.0)
MCH: 25 pg — ABNORMAL LOW (ref 26.0–34.0)
MCHC: 32.5 g/dL (ref 30.0–36.0)
MCV: 77 fL — ABNORMAL LOW (ref 78.0–100.0)
MONO ABS: 0.5 10*3/uL (ref 0.1–1.0)
Monocytes Relative: 5 %
Neutro Abs: 8.3 10*3/uL — ABNORMAL HIGH (ref 1.7–7.7)
Neutrophils Relative %: 72 %
PLATELETS: 174 10*3/uL (ref 150–400)
RBC: 5 MIL/uL (ref 3.87–5.11)
RDW: 12.9 % (ref 11.5–15.5)
WBC: 11.5 10*3/uL — ABNORMAL HIGH (ref 4.0–10.5)

## 2015-07-01 LAB — URINALYSIS, ROUTINE W REFLEX MICROSCOPIC
BILIRUBIN URINE: NEGATIVE
Hgb urine dipstick: NEGATIVE
KETONES UR: NEGATIVE mg/dL
LEUKOCYTES UA: NEGATIVE
NITRITE: NEGATIVE
PROTEIN: NEGATIVE mg/dL
Specific Gravity, Urine: 1.024 (ref 1.005–1.030)
Urobilinogen, UA: 0.2 mg/dL (ref 0.0–1.0)
pH: 6 (ref 5.0–8.0)

## 2015-07-01 LAB — I-STAT CG4 LACTIC ACID, ED: LACTIC ACID, VENOUS: 2.33 mmol/L — AB (ref 0.5–2.0)

## 2015-07-01 LAB — CBG MONITORING, ED: Glucose-Capillary: 94 mg/dL (ref 65–99)

## 2015-07-01 LAB — COMPREHENSIVE METABOLIC PANEL
ALBUMIN: 3.4 g/dL — AB (ref 3.5–5.0)
ALT: 22 U/L (ref 14–54)
ANION GAP: 10 (ref 5–15)
AST: 22 U/L (ref 15–41)
Alkaline Phosphatase: 96 U/L (ref 38–126)
BUN: 9 mg/dL (ref 6–20)
CHLORIDE: 100 mmol/L — AB (ref 101–111)
CO2: 25 mmol/L (ref 22–32)
Calcium: 9 mg/dL (ref 8.9–10.3)
Creatinine, Ser: 0.96 mg/dL (ref 0.44–1.00)
GFR calc Af Amer: >60 mL/min (ref >60)
GFR calc non Af Amer: >60 mL/min (ref >60)
GLUCOSE: 441 mg/dL — AB (ref 65–99)
POTASSIUM: 4.3 mmol/L (ref 3.5–5.1)
Sodium: 135 mmol/L (ref 135–145)
Total Bilirubin: 0.4 mg/dL (ref 0.3–1.2)
Total Protein: 7 g/dL (ref 6.5–8.1)

## 2015-07-01 LAB — URINE MICROSCOPIC-ADD ON

## 2015-07-01 LAB — I-STAT TROPONIN, ED: TROPONIN I, POC: 0 ng/mL (ref 0.00–0.08)

## 2015-07-01 MED ORDER — SODIUM CHLORIDE 0.9 % IV BOLUS (SEPSIS)
1000.0000 mL | Freq: Once | INTRAVENOUS | Status: AC
  Administered 2015-07-01: 1000 mL via INTRAVENOUS

## 2015-07-01 MED ORDER — DIAZEPAM 5 MG PO TABS
5.0000 mg | ORAL_TABLET | Freq: Once | ORAL | Status: AC
  Administered 2015-07-01: 5 mg via ORAL
  Filled 2015-07-01: qty 1

## 2015-07-01 MED ORDER — INSULIN ASPART 100 UNIT/ML ~~LOC~~ SOLN
10.0000 [IU] | Freq: Once | SUBCUTANEOUS | Status: AC
  Administered 2015-07-01: 10 [IU] via INTRAVENOUS
  Filled 2015-07-01: qty 1

## 2015-07-01 MED ORDER — CYCLOBENZAPRINE HCL 10 MG PO TABS: 10.0000 mg | ORAL_TABLET | Freq: Two times a day (BID) | ORAL | Status: DC | PRN
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
## 2015-07-01 NOTE — Discharge Instructions (Signed)

## 2015-07-01 NOTE — ED Provider Notes (Signed)
MSE was initiated and I personally evaluated the patient and placed orders (if any) at  2:25 PM on July 01, 2015.  Rachel Robertson is a 49 y.o. female with a PMHx of DM2, back pain, HTN, and carotid stenosis, who presents to the Emergency Department complaining of constant, tight, non-radiating, 10/10 gradually worsening upper back/neck pain onset 3 years ago that worsened two months ago and again yesterday with no new or initiating injury; worse with movement of arms and breathing; unrelieved by Tramadol. Pt's husband translates for her, which limits the history. The husband reports associated dizziness and CP that he reports accounts for "35%" of the patient's overall pain. At times, the patient feels like her back pain takes her breath away. Patient has been seen in the ED for this complaint before where she was prescribed Tramadol and ibuprofen without relief. She denies fever, chills, n/v/c/d, dysuria, hematuria, abdominal pain. Given the symptoms she's complaining of, will start a work up with labs, CXR, and EKG, and cardiac monitoring, and move to a pod bed.  The patient appears stable so that the remainder of the MSE may be completed by another provider.  [address]Dakwan Pridgen Daleamprubi-Soms, PA-C 07/01/15 1427  Leta BaptistEmily Roe Nguyen, MD 07/01/15 2155

## 2015-07-01 NOTE — ED Notes (Signed)
Pt sts upper back pain and neck pain x 3 years worse over last month; pt sts feels muscles are tense in that area

## 2015-07-01 NOTE — ED Notes (Signed)
Pt speaks SOMALI-- husband had to leave to go to work--  Requests female provider.

## 2015-07-01 NOTE — ED Notes (Signed)
Patient transported to X-ray 

## 2016-03-31 ENCOUNTER — Emergency Department (HOSPITAL_COMMUNITY)
Admission: EM | Admit: 2016-03-31 | Discharge: 2016-04-01 | Disposition: A | Discharge status: Home / Self Care | Payer: Medicaid Other | Attending: Emergency Medicine | Admitting: Emergency Medicine

## 2016-03-31 ENCOUNTER — Encounter (HOSPITAL_COMMUNITY): Payer: Self-pay | Admitting: Emergency Medicine

## 2016-03-31 DIAGNOSIS — Z7982 Long term (current) use of aspirin: Secondary | ICD-10-CM | POA: Diagnosis not present

## 2016-03-31 DIAGNOSIS — Z7984 Long term (current) use of oral hypoglycemic drugs: Secondary | ICD-10-CM | POA: Insufficient documentation

## 2016-03-31 DIAGNOSIS — I10 Essential (primary) hypertension: Secondary | ICD-10-CM | POA: Insufficient documentation

## 2016-03-31 DIAGNOSIS — R1013 Epigastric pain: Secondary | ICD-10-CM

## 2016-03-31 DIAGNOSIS — E119 Type 2 diabetes mellitus without complications: Secondary | ICD-10-CM | POA: Diagnosis not present

## 2016-03-31 DIAGNOSIS — R1010 Upper abdominal pain, unspecified: Secondary | ICD-10-CM | POA: Diagnosis present

## 2016-03-31 DIAGNOSIS — Z79899 Other long term (current) drug therapy: Secondary | ICD-10-CM | POA: Diagnosis not present

## 2016-03-31 LAB — CBC
HCT: 43.7 % (ref 36.0–46.0)
Hemoglobin: 14.7 g/dL (ref 12.0–15.0)
MCH: 25.7 pg — ABNORMAL LOW (ref 26.0–34.0)
MCHC: 33.6 g/dL (ref 30.0–36.0)
MCV: 76.4 fL — ABNORMAL LOW (ref 78.0–100.0)
Platelets: 247 10*3/uL (ref 150–400)
RBC: 5.72 MIL/uL — ABNORMAL HIGH (ref 3.87–5.11)
RDW: 12.5 % (ref 11.5–15.5)
WBC: 19.7 10*3/uL — ABNORMAL HIGH (ref 4.0–10.5)

## 2016-03-31 LAB — LIPASE, BLOOD: Lipase: 23 U/L (ref 11–51)

## 2016-03-31 LAB — COMPREHENSIVE METABOLIC PANEL
ALT: 29 U/L (ref 14–54)
AST: 28 U/L (ref 15–41)
Albumin: 4 g/dL (ref 3.5–5.0)
Alkaline Phosphatase: 98 U/L (ref 38–126)
Anion gap: 13 (ref 5–15)
BUN: 9 mg/dL (ref 6–20)
CO2: 20 mmol/L — ABNORMAL LOW (ref 22–32)
Calcium: 9.4 mg/dL (ref 8.9–10.3)
Chloride: 102 mmol/L (ref 101–111)
Creatinine, Ser: 0.92 mg/dL (ref 0.44–1.00)
GFR calc Af Amer: >60 mL/min (ref >60)
GFR calc non Af Amer: >60 mL/min (ref >60)
Glucose, Bld: 273 mg/dL — ABNORMAL HIGH (ref 65–99)
Potassium: 4.4 mmol/L (ref 3.5–5.1)
Sodium: 135 mmol/L (ref 135–145)
Total Bilirubin: 0.7 mg/dL (ref 0.3–1.2)
Total Protein: 7.5 g/dL (ref 6.5–8.1)

## 2016-03-31 MED ORDER — FAMOTIDINE IN NACL 20-0.9 MG/50ML-% IV SOLN
20.0000 mg | Freq: Once | INTRAVENOUS | Status: AC
  Administered 2016-03-31: 20 mg via INTRAVENOUS
  Filled 2016-03-31: qty 50

## 2016-03-31 MED ORDER — ONDANSETRON 4 MG PO TBDP
ORAL_TABLET | ORAL | Status: AC
  Filled 2016-03-31: qty 1

## 2016-03-31 MED ORDER — HYDROMORPHONE HCL 1 MG/ML IJ SOLN
0.7500 mg | Freq: Once | INTRAMUSCULAR | Status: AC
  Administered 2016-03-31: 0.75 mg via INTRAVENOUS
  Filled 2016-03-31: qty 1

## 2016-03-31 MED ORDER — ONDANSETRON HCL 4 MG/2ML IJ SOLN
4.0000 mg | Freq: Once | INTRAMUSCULAR | Status: AC
  Administered 2016-03-31: 4 mg via INTRAVENOUS
  Filled 2016-03-31: qty 2

## 2016-03-31 MED ORDER — ONDANSETRON 4 MG PO TBDP
4.0000 mg | ORAL_TABLET | Freq: Once | ORAL | Status: AC | PRN
  Administered 2016-03-31: 4 mg via ORAL

## 2016-03-31 MED ORDER — IOPAMIDOL (ISOVUE-300) INJECTION 61%
INTRAVENOUS | Status: AC
  Administered 2016-04-01: 100 mL
  Filled 2016-03-31: qty 100

## 2016-03-31 MED ORDER — SODIUM CHLORIDE 0.9 % IV BOLUS (SEPSIS)
1000.0000 mL | Freq: Once | INTRAVENOUS | Status: AC
  Administered 2016-03-31: 1000 mL via INTRAVENOUS

## 2016-03-31 NOTE — ED Notes (Signed)
Pt here with severe upper abd pain, diarrhea and emesis since 5pm today. Pt denies fevers.

## 2016-03-31 NOTE — ED Provider Notes (Signed)
CSN: [id number]     Arrival date & time 03/31/16  2143 History  By signing my name below, I, Soijett Blue, attest that this documentation has been prepared under the direction and in the presence of Raeford Razor, MD. Electronically Signed: Soijett Blue, ED Scribe. 03/31/2016. 11:15 PM.   Chief Complaint  Patient presents with  . Abdominal Pain  . Diarrhea  . Emesis      The history is provided by the patient and a relative. A language interpreter was used (son).    HPI Comments: Rachel Robertson is a 50 y.o. female with a medical hx of HTN, DM, who presents to the Emergency Department complaining of 10/10, pressure sensation, intermittent, upper abdominal pain onset 5 PM today. Pt notes that she felt gassy and she was experiencing back pain prior to the onset of her current symptoms. Pt reports that her abdominal pain is alleviated with her Rx reflux medications that she has been taking x 2 weeks. Denies worsening factors of her abdominal pain. Pt notes that she had similar abdominal pain 1 month ago that resolved on its own. Pt denies sick contacts with similar symptoms. She states that she is having associated symptoms of diarrhea, vomiting, and mid-upper back pain x 2 months. She states that she has tried Rx reflux medications for the relief for her symptoms. She denies fever, blood in vomit, difficulty urinating, CP, SOB, and any other symptoms.    Past Medical History  Diagnosis Date  . Diabetes mellitus   . Back pain   . Hypertension   . Essential hypertension 06/15/2015  . Carotid stenosis 06/22/2015    1-39% bilateral carotid artery stenosis 06/2015.   Past Surgical History  Procedure Laterality Date  . Shoulder surgery     Family History  Problem Relation Age of Onset  . Diabetes Mother   . Hypertension Mother   . Diabetes Sister   . Hypertension Sister   . Hypertension Maternal Grandmother   . Diabetes Maternal Grandmother   . Hypertension Maternal Grandfather   .  Diabetes Maternal Grandfather   . Hypertension Paternal Grandmother   . Diabetes Paternal Grandmother   . Hypertension Paternal Grandfather   . Diabetes Paternal Grandfather    Social History  Substance Use Topics  . Smoking status: Never Smoker   . Smokeless tobacco: None  . Alcohol Use: No   OB History    No data available     Review of Systems  A complete 10 system review of systems was obtained and all systems are negative except as noted in the HPI and PMH.   Allergies  Metformin  Home Medications   Prior to Admission medications   Medication Sig Start Date End Date Taking? Authorizing Provider  acetaminophen (TYLENOL) 500 MG tablet Take 500 mg by mouth every 6 (six) hours as needed for mild pain.    Historical Provider, MD  aspirin EC 81 MG tablet Take 1 tablet (81 mg total) by mouth daily. 06/23/15   Chilton Si, MD  cyclobenzaprine (FLEXERIL) 10 MG tablet Take 1 tablet (10 mg total) by mouth 2 (two) times daily as needed for muscle spasms. 07/01/15   Felicie Morn, NP  glimepiride (AMARYL) 4 MG tablet Take 4 mg by mouth 2 (two) times daily.    Historical Provider, MD  HYDROcodone-acetaminophen (NORCO/VICODIN) 5-325 MG per tablet Take 1-2 tablets by mouth every 6 (six) hours as needed. Patient not taking: Reported on 07/01/2015 02/05/14   Junious Silk, PA-C  losartan (COZAAR) 50 MG tablet Take 50 mg by mouth daily.    Historical Provider, MD  metFORMIN (GLUCOPHAGE) 500 MG tablet Take 500 mg by mouth 2 (two) times daily with a meal.    Historical Provider, MD  methocarbamol (ROBAXIN) 500 MG tablet Take 500 mg by mouth 2 (two) times daily.    Historical Provider, MD  omeprazole (PRILOSEC) 20 MG capsule Take 20 mg by mouth daily.    Historical Provider, MD  ondansetron (ZOFRAN) 4 MG tablet Take 1 tablet (4 mg total) by mouth every 6 (six) hours. Patient not taking: Reported on 07/01/2015 02/05/14   Junious SilkHannah Merrell, PA-C  PROAIR HFA 108 [phone](90 BASE) MCG/ACT inhaler  02/27/15    Historical Provider, MD  sitaGLIPtin (JANUVIA) 100 MG tablet Take 100 mg by mouth daily.    Historical Provider, MD  tiZANidine (ZANAFLEX) 4 MG tablet Take 4 mg by mouth every 6 (six) hours as needed for muscle spasms.    Historical Provider, MD  traMADol (ULTRAM) 50 MG tablet Take 50 mg by mouth every 6 (six) hours as needed. For pain    Historical Provider, MD  Vitamin D, Ergocalciferol, (DRISDOL) 50000 UNITS CAPS capsule Take 50,000 Units by mouth every 7 (seven) days. Every Monday    Historical Provider, MD   BP 147/100 mmHg  Pulse 105  Temp(Src) 97.8 F (36.6 C) (Oral)  Resp 22  SpO2 100%  LMP 02/16/2012 Physical Exam  Constitutional: She is oriented to person, place, and time. She appears well-developed and well-nourished. No distress.  Pt appears uncomfortable.   HENT:  Head: Normocephalic and atraumatic.  Eyes: EOM are normal.  Neck: Neck supple.  Cardiovascular: Normal rate, regular rhythm and normal heart sounds.  Exam reveals no gallop and no friction rub.   No murmur heard. Pulmonary/Chest: Effort normal and breath sounds normal. No respiratory distress. She has no wheezes. She has no rales.  Abdominal: Soft. She exhibits no distension. There is tenderness in the epigastric area. There is guarding.  Upper abdomen tenderness worse in epigastrium. Voluntary guarding.    Musculoskeletal: Normal range of motion.  Neurological: She is alert and oriented to person, place, and time.  Skin: Skin is warm and dry.  Psychiatric: She has a normal mood and affect. Her behavior is normal.  Nursing note and vitals reviewed.   ED Course  Procedures (including critical care time) DIAGNOSTIC STUDIES: Oxygen Saturation is 100% on RA, nl by my interpretation.    COORDINATION OF CARE: 11:15 PM Discussed treatment plan with pt at bedside which includes labs, UA, IV fluids, pepcid, dilaudid, zofran, CT abdomen pelvis, and pt agreed to plan.   Labs Review Labs Reviewed  COMPREHENSIVE  METABOLIC PANEL - Abnormal; Notable for the following:    CO2 20 (*)    Glucose, Bld 273 (*)    All other components within normal limits  CBC - Abnormal; Notable for the following:    WBC 19.7 (*)    RBC 5.72 (*)    MCV 76.4 (*)    MCH 25.7 (*)    All other components within normal limits  URINALYSIS, ROUTINE W REFLEX MICROSCOPIC (NOT [facility]) - Abnormal; Notable for the following:    Specific Gravity, Urine 1.031 (*)    Glucose, UA 500 (*)    Ketones, ur 15 (*)    All other components within normal limits  LIPASE, BLOOD    Imaging Review Ct Abdomen Pelvis W Contrast  04/01/2016  CLINICAL DATA:  Abdominal pain  with diarrhea and emesis EXAM: CT ABDOMEN AND PELVIS WITH CONTRAST TECHNIQUE: Multidetector CT imaging of the abdomen and pelvis was performed using the standard protocol following bolus administration of intravenous contrast. CONTRAS  100mL ISOVUE-300 IOPAMIDOL (ISOVUE-300) INJECTION 61% COMPARISON:  May 26, 2006 FINDINGS: Normal lung bases. No free air. There is a small amount of free fluid in the pelvis. Hepatic steatosis is identified. The liver, portal vein, and gallbladder are otherwise normal. The spleen, adrenal glands, and pancreas are normal. Evaluation of the kidneys is somewhat limited due to respiratory motion. Within this limitation, no renal abnormalities are identified. The abdominal aorta is normal in caliber. No adenopathy. The stomach and small bowel are within normal limits. The colon and appendix are normal in appearance. There is a small amount of fluid in the pelvis. The uterus is normal in appearance. The right ovary normal. There is a chunky calcification the left ovary which was also present in 2007 but is more prominent today. This is of doubtful acute significance. The bladder is within normal limits. No other abnormalities. The visualized bones are unremarkable. Delayed images through the upper abdomen demonstrate no filling defects in the upper renal  collecting systems. IMPRESSION: 1. There is a small amount of free fluid in the pelvis which is nonspecific in a patient of this age. If the patient is premenopausal, this could be physiologic. No other acute abnormalities. Electronically Signed   By: Gerome Sam III M.D   On: 04/01/2016 01:27   I have personally reviewed and evaluated these images and lab results as part of my medical decision-making.   MDM   Final diagnoses:  Epigastric pain   I personally preformed the services scribed in my presence. The recorded information has been reviewed is accurate. Raeford Razor, MD.    Raeford Razor, MD 04/15/16 2159

## 2016-03-31 NOTE — ED Notes (Signed)
PA at bedside.

## 2016-04-01 ENCOUNTER — Emergency Department (HOSPITAL_COMMUNITY): Payer: Medicaid Other

## 2016-04-01 LAB — URINALYSIS, ROUTINE W REFLEX MICROSCOPIC
Bilirubin Urine: NEGATIVE
Glucose, UA: 500 mg/dL — AB
Hgb urine dipstick: NEGATIVE
Ketones, ur: 15 mg/dL — AB
Leukocytes, UA: NEGATIVE
Nitrite: NEGATIVE
Protein, ur: NEGATIVE mg/dL
Specific Gravity, Urine: 1.031 — ABNORMAL HIGH (ref 1.005–1.030)
pH: 7 (ref 5.0–8.0)

## 2016-04-01 MED ORDER — ONDANSETRON HCL 4 MG PO TABS: 4.0000 mg | ORAL_TABLET | Freq: Four times a day (QID) | ORAL | Status: DC

## 2016-04-01 NOTE — ED Notes (Signed)
Pt transported to bathroom

## 2016-04-01 NOTE — ED Notes (Signed)
Patient transported to CT 

## 2016-04-01 NOTE — Discharge Instructions (Signed)

## 2017-01-30 ENCOUNTER — Encounter (HOSPITAL_COMMUNITY): Payer: Self-pay

## 2017-01-30 ENCOUNTER — Encounter (HOSPITAL_COMMUNITY): Payer: Self-pay | Admitting: *Deleted

## 2017-01-30 ENCOUNTER — Ambulatory Visit (HOSPITAL_COMMUNITY)
Admission: EM | Admit: 2017-01-30 | Discharge: 2017-01-30 | Disposition: A | Discharge status: Nursing Home (Includes ICF) | Payer: Medicaid Other | Attending: Family Medicine | Admitting: Family Medicine

## 2017-01-30 ENCOUNTER — Emergency Department (HOSPITAL_COMMUNITY)
Admission: EM | Admit: 2017-01-30 | Discharge: 2017-01-30 | Disposition: A | Discharge status: Home / Self Care | Payer: Medicaid Other | Attending: Emergency Medicine | Admitting: Emergency Medicine

## 2017-01-30 DIAGNOSIS — Z794 Long term (current) use of insulin: Secondary | ICD-10-CM | POA: Diagnosis not present

## 2017-01-30 DIAGNOSIS — T7800XA Anaphylactic reaction due to unspecified food, initial encounter: Secondary | ICD-10-CM | POA: Diagnosis not present

## 2017-01-30 DIAGNOSIS — T7840XA Allergy, unspecified, initial encounter: Secondary | ICD-10-CM | POA: Diagnosis present

## 2017-01-30 DIAGNOSIS — I10 Essential (primary) hypertension: Secondary | ICD-10-CM | POA: Insufficient documentation

## 2017-01-30 DIAGNOSIS — E119 Type 2 diabetes mellitus without complications: Secondary | ICD-10-CM | POA: Diagnosis not present

## 2017-01-30 DIAGNOSIS — Z79899 Other long term (current) drug therapy: Secondary | ICD-10-CM | POA: Insufficient documentation

## 2017-01-30 DIAGNOSIS — L509 Urticaria, unspecified: Secondary | ICD-10-CM | POA: Diagnosis not present

## 2017-01-30 DIAGNOSIS — T783XXA Angioneurotic edema, initial encounter: Secondary | ICD-10-CM

## 2017-01-30 DIAGNOSIS — T782XXA Anaphylactic shock, unspecified, initial encounter: Secondary | ICD-10-CM

## 2017-01-30 HISTORY — DX: Gastro-esophageal reflux disease without esophagitis: K21.9

## 2017-01-30 MED ORDER — FAMOTIDINE IN NACL 20-0.9 MG/50ML-% IV SOLN
20.0000 mg | Freq: Once | INTRAVENOUS | Status: AC
  Administered 2017-01-30: 20 mg via INTRAVENOUS
  Filled 2017-01-30: qty 50

## 2017-01-30 MED ORDER — EPINEPHRINE 0.3 MG/0.3ML IJ SOAJ: 0.3000 mg | Freq: Once | INTRAMUSCULAR | Status: DC

## 2017-01-30 MED ORDER — EPINEPHRINE PF 1 MG/ML IJ SOLN
INTRAMUSCULAR | Status: AC
  Filled 2017-01-30: qty 1

## 2017-01-30 MED ORDER — EPINEPHRINE PF 1 MG/ML IJ SOLN
0.3000 mg | Freq: Once | INTRAMUSCULAR | Status: AC
  Administered 2017-01-30: 0.3 mg via INTRAMUSCULAR

## 2017-01-30 MED ORDER — METHYLPREDNISOLONE SODIUM SUCC 125 MG IJ SOLR
INTRAMUSCULAR | Status: AC
  Filled 2017-01-30: qty 2

## 2017-01-30 MED ORDER — DIPHENHYDRAMINE HCL 25 MG PO CAPS
25.0000 mg | ORAL_CAPSULE | Freq: Once | ORAL | Status: AC
  Administered 2017-01-30: 25 mg via ORAL

## 2017-01-30 MED ORDER — EPINEPHRINE 0.3 MG/0.3ML IJ SOAJ: 0.3000 mg | Freq: Once | INTRAMUSCULAR | 0 refills | Status: AC

## 2017-01-30 MED ORDER — METHYLPREDNISOLONE SODIUM SUCC 125 MG IJ SOLR: 80.0000 mg | Freq: Once | INTRAMUSCULAR | Status: DC

## 2017-01-30 MED ORDER — METHYLPREDNISOLONE ACETATE 80 MG/ML IJ SUSP
80.0000 mg | Freq: Once | INTRAMUSCULAR | Status: AC
  Administered 2017-01-30: 80 mg via INTRAMUSCULAR

## 2017-01-30 MED ORDER — DIPHENHYDRAMINE HCL 25 MG PO CAPS
ORAL_CAPSULE | ORAL | Status: AC
  Filled 2017-01-30: qty 1

## 2017-01-30 MED ORDER — METHYLPREDNISOLONE ACETATE 80 MG/ML IJ SUSP
INTRAMUSCULAR | Status: AC
  Filled 2017-01-30: qty 1

## 2017-01-30 MED ORDER — PREDNISONE 20 MG PO TABS: 60.0000 mg | ORAL_TABLET | Freq: Every day | ORAL | 0 refills | Status: AC

## 2017-01-30 NOTE — ED Notes (Signed)
ED Provider at bedside. 

## 2017-01-30 NOTE — ED Provider Notes (Signed)
[facility]-EMERGENCY DEPT Provider Note   CSN: [id number] Arrival date & time: 01/30/17  1940     History   Chief Complaint Chief Complaint  Patient presents with  . Allergic Reaction    HPI Rachel Robertson is a 51 y.o. female.  The history is provided by the patient and a relative.  Allergic Reaction  Presenting symptoms: difficulty breathing, difficulty swallowing, itching, rash, swelling and wheezing   Severity:  Severe Duration:  10 hours Prior allergic episodes:  No prior episodes Context: food (possible soup she has last night)   Context: not animal exposure, not chemicals, not cosmetics, not dairy/milk products, not eggs, not grass, not insect bite/sting, not medications, not new detergents/soaps, not nuts and not poison ivy   Relieved by:  Antihistamines, epinephrine and steroids   Past Medical History:  Diagnosis Date  . Back pain   . Carotid stenosis 06/22/2015   1-39% bilateral carotid artery stenosis 06/2015.  . Diabetes mellitus   . Essential hypertension 06/15/2015  . GERD (gastroesophageal reflux disease)   . Hypertension     Patient Active Problem List   Diagnosis Date Noted  . Carotid stenosis 06/22/2015  . Essential hypertension 06/15/2015  . DIABETES MELLITUS, TYPE II 05/08/2007  . ALLERGIC RHINITIS 05/08/2007  . LOW BACK PAIN, CHRONIC 05/08/2007  . SHOULDER IMPINGEMENT SYNDROME 05/08/2007    Past Surgical History:  Procedure Laterality Date  . SHOULDER SURGERY      OB History    No data available       Home Medications    Prior to Admission medications   Medication Sig Start Date End Date Taking? Authorizing Provider  dicyclomine (BENTYL) 10 MG capsule Take 10 mg by mouth 2 (two) times daily as needed for spasms (gas).   Yes [provider]  glimepiride (AMARYL) 4 MG tablet Take 4 mg by mouth 2 (two) times daily.   Yes [provider]  ibuprofen (ADVIL,MOTRIN) 800 MG tablet Take 800 mg by mouth every 8 (eight) hours as  needed for mild pain.   Yes [provider]  Insulin Detemir (LEVEMIR FLEXPEN) 100 UNIT/ML Pen Inject 20 Units into the skin daily at 10 pm.   Yes [provider]  losartan (COZAAR) 25 MG tablet Take 25 mg by mouth daily.   Yes [provider]  pantoprazole (PROTONIX) 40 MG tablet Take 40 mg by mouth daily.   Yes [provider]  sitaGLIPtin (JANUVIA) 100 MG tablet Take 100 mg by mouth daily.   Yes [provider]  Vitamin D, Ergocalciferol, (DRISDOL) 50000 UNITS CAPS capsule Take 50,000 Units by mouth every 7 (seven) days. Every Monday   Yes [provider]  cyclobenzaprine (FLEXERIL) 10 MG tablet Take 1 tablet (10 mg total) by mouth 2 (two) times daily as needed for muscle spasms. Patient not taking: Reported on 01/30/2017 07/01/15   Felicie Morn, NP  EPINEPHrine (ADRENACLICK) 0.3 mg/0.3 mL IJ SOAJ injection Inject 0.3 mLs (0.3 mg total) into the muscle once. 01/30/17 01/30/17  Makina Skow, DO  ondansetron (ZOFRAN) 4 MG tablet Take 1 tablet (4 mg total) by mouth every 6 (six) hours. Patient not taking: Reported on 01/30/2017 04/01/16   Raeford Razor, MD  predniSONE (DELTASONE) 20 MG tablet Take 3 tablets (60 mg total) by mouth daily. 01/30/17 02/02/17  Virgina Norfolk, DO    Family History Family History  Problem Relation Age of Onset  . Diabetes Mother   . Hypertension Mother   . Diabetes Sister   .  Hypertension Sister   . Hypertension Maternal Grandmother   . Diabetes Maternal Grandmother   . Hypertension Maternal Grandfather   . Diabetes Maternal Grandfather   . Hypertension Paternal Grandmother   . Diabetes Paternal Grandmother   . Hypertension Paternal Grandfather   . Diabetes Paternal Grandfather     Social History Social History  Substance Use Topics  . Smoking status: Never Smoker  . Smokeless tobacco: Never Used  . Alcohol use No     Allergies   Metformin   Review of Systems Review of Systems  Constitutional:  Negative for chills and fever.  HENT: Positive for facial swelling and trouble swallowing. Negative for ear pain and sore throat.   Eyes: Negative for pain and visual disturbance.  Respiratory: Positive for wheezing. Negative for cough and shortness of breath.   Cardiovascular: Negative for chest pain and palpitations.  Gastrointestinal: Negative for abdominal pain and vomiting.  Genitourinary: Negative for dysuria and hematuria.  Musculoskeletal: Negative for arthralgias and back pain.  Skin: Positive for itching and rash. Negative for color change.  Neurological: Negative for seizures and syncope.  All other systems reviewed and are negative.    Physical Exam Updated Vital Signs ED Triage Vitals  Enc Vitals Group     BP 01/30/17 1945 (!) 142/98     Pulse Rate 01/30/17 1945 80     Resp 01/30/17 1945 (!) 22     Temp 01/30/17 1945 97.3 F (36.3 C)     Temp Source 01/30/17 1945 Oral     SpO2 01/30/17 1945 97 %     Weight --      Height --      Head Circumference --      Peak Flow --      Pain Score 01/30/17 1948 0     Pain Loc --      Pain Edu? --      Excl. in GC? --     Physical Exam  Constitutional: She is oriented to person, place, and time. She appears well-developed and well-nourished. No distress.  HENT:  Head: Normocephalic and atraumatic.  Nose: Nose normal.  Mouth/Throat: No oropharyngeal exudate.  No swelling of tongue or lips  Eyes: Conjunctivae are normal.  Neck: Neck supple.  Cardiovascular: Normal rate, regular rhythm, normal heart sounds and intact distal pulses.   No murmur heard. Pulmonary/Chest: Effort normal and breath sounds normal. No respiratory distress. She has no wheezes.  Abdominal: Soft. Bowel sounds are normal. She exhibits no distension. There is no tenderness.  Musculoskeletal: Normal range of motion. She exhibits no edema.  Neurological: She is alert and oriented to person, place, and time.  Skin: Skin is warm and dry. Rash (hives on  back and legs) noted.  Psychiatric: She has a normal mood and affect.  Nursing note and vitals reviewed.    ED Treatments / Results  Labs (all labs ordered are listed, but only abnormal results are displayed) Labs Reviewed - No data to display  EKG  EKG Interpretation None       Radiology No results found.  Procedures Procedures (including critical care time)  Medications Ordered in ED Medications  famotidine (PEPCID) IVPB 20 mg premix (0 mg Intravenous Stopped 01/30/17 2159)     Initial Impression / Assessment and Plan / ED Course  I have reviewed the triage vital signs and the nursing notes.  Pertinent labs & imaging results that were available during my care of the patient were reviewed by me and  considered in my medical decision making (see chart for details).     Rachel Robertson is a 51 year old female with history of diabetes, hypertension who presents to the ED with allergic reaction. Patient vitals at time of arrival to the ED are unremarkable and patient is without fever. Patient comes from an urgent care center where she was treated for anaphylaxis with epinephrine, methylprednisone, Benadryl at around 7 PM. Patient denies any history of anaphylaxis or allergies. Patient does not take an ACE inhibitor. She denies any new medications or new contacts. She does suspect possibly a food allergy to soup that she had last night. Symptoms began around midnight about 10 hours ago with rash on her back and legs that slowly progressed throughout the day to include her breathing with wheezing and lip and tongue swelling. Symptoms have vastly improved following epinephrine, steroids, Benadryl. Patient with unremarkable exam except for hives to her back and legs that according to patient are improving. Patient has no wheezing and no GI symptoms. Patient given IV famotidine and will observe in the ED for re-anaphylaxis for a total 4 hours.  Patient was reevaluated and remains  hemodynamically stable and symptom free. Patient given prescription for prednisone and recommend continued use of Benadryl at home as needed. Patient given prescription for EpiPen and educated about the uses. Recommend follow-up w/ her primary care provider as patient likely needs referral to allergist. Told to return to the ED if symptoms worsen and discharged from ED in good condition.  Final Clinical Impressions(s) / ED Diagnoses   Final diagnoses:  Anaphylaxis, initial encounter    New Prescriptions New Prescriptions   EPINEPHRINE (ADRENACLICK) 0.3 MG/0.3 ML IJ SOAJ INJECTION    Inject 0.3 mLs (0.3 mg total) into the muscle once.   PREDNISONE (DELTASONE) 20 MG TABLET    Take 3 tablets (60 mg total) by mouth daily.     Virgina Norfolk, DO 01/30/17 2315

## 2017-01-30 NOTE — ED Notes (Signed)
Pt stable, understands discharge instructions, and reasons for return.   

## 2017-01-30 NOTE — Discharge Instructions (Signed)
Continue use of benadryl for itchiness.

## 2017-01-30 NOTE — ED Provider Notes (Signed)
[facility]    CSN: [id number] Arrival date & time: 01/30/17  1708     History   Chief Complaint Chief Complaint  Patient presents with  . Allergic Reaction    HPI Rachel Robertson is a 51 y.o. female.   The history is provided by the patient (daughter). The history is limited by a language barrier. Language interpreter used: She asked that her daughter help with interpretation.  Rash  Location:  Full body Quality: itchiness and redness   Quality: not blistering, not burning, not swelling and not weeping   Severity:  Severe Onset quality:  Gradual (Patient went to a get together yesterday and ate a bunch of stuff. She woke up this morning with generalized rash, severe itching, tongue and lip swelling) Timing:  Constant Progression:  Worsening Chronicity:  New Context: food   Context: not animal contact, not chemical exposure, not insect bite/sting, not medications, not new detergent/soap, not plant contact and not sick contacts   Relieved by:  Nothing Worsened by:  Nothing Ineffective treatments: bathing with warm water. Associated symptoms: hoarse voice and tongue swelling   Associated symptoms: no abdominal pain, no fever, no headaches, no nausea, no shortness of breath, no throat swelling, not vomiting and not wheezing   Associated symptoms comment:  This morning she woke up with tightness in her throat.   Past Medical History:  Diagnosis Date  . Back pain   . Carotid stenosis 06/22/2015   1-39% bilateral carotid artery stenosis 06/2015.  . Diabetes mellitus   . Essential hypertension 06/15/2015  . GERD (gastroesophageal reflux disease)   . Hypertension     Patient Active Problem List   Diagnosis Date Noted  . Carotid stenosis 06/22/2015  . Essential hypertension 06/15/2015  . DIABETES MELLITUS, TYPE II 05/08/2007  . ALLERGIC RHINITIS 05/08/2007  . LOW BACK PAIN, CHRONIC 05/08/2007  . SHOULDER IMPINGEMENT SYNDROME 05/08/2007    Past Surgical  History:  Procedure Laterality Date  . SHOULDER SURGERY      OB History    No data available       Home Medications    Prior to Admission medications   Medication Sig Start Date End Date Taking? Authorizing Provider  cetirizine (ZYRTEC) 10 MG tablet Take 10 mg by mouth daily as needed for allergies.   Yes [provider]  cyclobenzaprine (FLEXERIL) 10 MG tablet Take 1 tablet (10 mg total) by mouth 2 (two) times daily as needed for muscle spasms. 07/01/15  Yes Felicie MornSmith, David, NP  dicyclomine (BENTYL) 10 MG capsule Take 10 mg by mouth 2 (two) times daily as needed for spasms (gas).   Yes [provider]  glimepiride (AMARYL) 4 MG tablet Take 4 mg by mouth 2 (two) times daily.   Yes [provider]  Insulin Detemir (LEVEMIR FLEXPEN) 100 UNIT/ML Pen Inject 20 Units into the skin daily at 10 pm.   Yes [provider]  meloxicam (MOBIC) 15 MG tablet Take 15 mg by mouth daily.   Yes [provider]  metFORMIN (GLUCOPHAGE) 500 MG tablet Take 500 mg by mouth 2 (two) times daily with a meal.   Yes [provider]  pantoprazole (PROTONIX) 40 MG tablet Take 40 mg by mouth daily.   Yes [provider]  simvastatin (ZOCOR) 10 MG tablet Take 10 mg by mouth daily.   Yes [provider]  sitaGLIPtin (JANUVIA) 100 MG tablet Take 100 mg by mouth daily.   Yes [provider]  tiZANidine (ZANAFLEX) 4 MG tablet Take 4 mg by mouth every 6 (six) hours as needed for muscle spasms.   Yes [provider]  traMADol (ULTRAM) 50 MG tablet Take 50 mg by mouth every 6 (six) hours as needed for moderate pain.    Yes [provider]  Vitamin D, Ergocalciferol, (DRISDOL) 50000 UNITS CAPS capsule Take 50,000 Units by mouth every 7 (seven) days. Every Monday   Yes [provider]  acetaminophen (TYLENOL) 500 MG tablet Take 500 mg by mouth every 6 (six) hours as needed for mild pain.    [provider]    Olopatadine HCl (PATADAY) 0.2 % SOLN Place 1-2 drops into both eyes daily as needed (dryness).    [provider]  ondansetron (ZOFRAN) 4 MG tablet Take 1 tablet (4 mg total) by mouth every 6 (six) hours. 04/01/16   Raeford Razor, MD    Family History Family History  Problem Relation Age of Onset  . Diabetes Mother   . Hypertension Mother   . Diabetes Sister   . Hypertension Sister   . Hypertension Maternal Grandmother   . Diabetes Maternal Grandmother   . Hypertension Maternal Grandfather   . Diabetes Maternal Grandfather   . Hypertension Paternal Grandmother   . Diabetes Paternal Grandmother   . Hypertension Paternal Grandfather   . Diabetes Paternal Grandfather     Social History Social History  Substance Use Topics  . Smoking status: Never Smoker  . Smokeless tobacco: Not on file  . Alcohol use No     Allergies   Metformin   Review of Systems Review of Systems  Constitutional: Negative for fever.  HENT: Positive for facial swelling, hoarse voice and voice change. Negative for ear discharge and mouth sores.   Eyes: Negative.   Respiratory: Negative.  Negative for shortness of breath and wheezing.   Cardiovascular: Negative.   Gastrointestinal: Negative for abdominal pain, nausea and vomiting.  Skin: Positive for rash.  Neurological: Negative for headaches.  All other systems reviewed and are negative.    Physical Exam Triage Vital Signs ED Triage Vitals [01/30/17 1807]  Enc Vitals Group     BP (!) 146/68     Pulse Rate 74     Resp 20     Temp 98 F (36.7 C)     Temp Source Oral     SpO2 99 %     Weight      Height      Head Circumference      Peak Flow      Pain Score      Pain Loc      Pain Edu?      Excl. in GC?    No data found.   Updated Vital Signs BP (!) 146/68   Pulse 74   Temp 98 F (36.7 C) (Oral)   Resp 20   LMP 02/16/2012   SpO2 99%   Visual Acuity Right Eye Distance:   Left Eye Distance:   Bilateral  Distance:    Right Eye Near:   Left Eye Near:    Bilateral Near:     Physical Exam  Constitutional: She appears well-developed. She appears distressed.  In distress due to severe itching  HENT:  Head: Normocephalic and atraumatic.  Right Ear: Tympanic membrane and ear canal normal.  Left Ear: Tympanic membrane and ear canal normal.  Mouth/Throat: No oropharyngeal exudate.    Eyes: Conjunctivae, EOM and lids are normal. Pupils are equal, round,  and reactive to light.  Neck: Trachea normal and normal range of motion.  Cardiovascular: Regular rhythm and normal heart sounds.   No murmur heard. Pulmonary/Chest: Effort normal and breath sounds normal. She has no decreased breath sounds. She has no wheezes. She has no rhonchi. She has no rales.  Skin: Skin is warm. Rash noted. Rash is urticarial.     Nursing note and vitals reviewed.    UC Treatments / Results  Labs (all labs ordered are listed, but only abnormal results are displayed) Labs Reviewed - No data to display  EKG  EKG Interpretation None       Radiology No results found.  Procedures Procedures (including critical care time)  Medications Ordered in UC Medications - No data to display   Initial Impression / Assessment and Plan / UC Course  I have reviewed the triage vital signs and the nursing notes.  Pertinent labs & imaging results that were available during my care of the patient were reviewed by me and considered in my medical decision making (see chart for details).  Clinical Course as of Jan 31 1915  Sun Jan 30, 2017  1915 Urticaria and Angioema Likely food allergy. She is uncertain what food she is reacting to. Epi inject given today. Solumedrol IM x 1 Benadryl 25 mg Oral x 1 I recommended ED for 24 hours observation. F/U as needed.  [KE]    Clinical Course User Index [KE] Doreene Eland, MD      Final Clinical Impressions(s) / UC Diagnoses   Final diagnoses:  None    New  Prescriptions New Prescriptions   No medications on file     Doreene Eland, MD 01/30/17 1916

## 2017-01-30 NOTE — ED Provider Notes (Signed)
I have personally seen and examined the patient. I have reviewed the documentation on PMH/FH/Soc Hx. I have discussed the plan of care with the resident and patient.  I have reviewed and agree with the resident's documentation. Please see associated encounter note.   EKG Interpretation None         Annaliza Zia, Amadeo GarnetPedro Eduardo, MD 01/30/17 2210

## 2017-01-30 NOTE — ED Notes (Signed)
Pt reports feeling better since having medications at UC. Pt still has rash over body and complains of mild itching but reports that it is much better than before. Pt reports that in the night when she woke she itching with swollen lips and face.

## 2017-01-30 NOTE — Discharge Instructions (Signed)
It was nice seeing you today. You might have something called angioedema. We have given you some treatment today. Please go to the ED for observation.

## 2017-01-30 NOTE — ED Triage Notes (Signed)
Pt from UC. Pt complaining of full body rash and throat swelling. Pt received epinephrine, benadryl and methylprednisolone at urgent care. Pt states feels much better now. Airway intact at triage. Pt a/o x 4.

## 2017-01-30 NOTE — ED Triage Notes (Signed)
Reports waking this morning with swollen lips, tongue, scalp, and generalized pruritis.  Swelling improved with cool shower.  Denies any new meds; states possible reaction to food.

## 2017-03-31 ENCOUNTER — Ambulatory Visit (INDEPENDENT_AMBULATORY_CARE_PROVIDER_SITE_OTHER): Payer: Medicaid Other | Admitting: Allergy & Immunology

## 2017-03-31 ENCOUNTER — Encounter: Payer: Self-pay | Admitting: Allergy & Immunology

## 2017-03-31 ENCOUNTER — Ambulatory Visit: Payer: Medicaid Other | Admitting: Allergy & Immunology

## 2017-03-31 VITALS — BP 130/70 | HR 71 | Temp 98.6°F | Resp 16 | Ht 61.81 in | Wt 172.2 lb

## 2017-03-31 DIAGNOSIS — R21 Rash and other nonspecific skin eruption: Secondary | ICD-10-CM | POA: Diagnosis not present

## 2017-03-31 DIAGNOSIS — J4540 Moderate persistent asthma, uncomplicated: Secondary | ICD-10-CM

## 2017-03-31 DIAGNOSIS — J3089 Other allergic rhinitis: Secondary | ICD-10-CM

## 2017-03-31 MED ORDER — ALBUTEROL SULFATE HFA 108 (90 BASE) MCG/ACT IN AERS: 2.0000 | INHALATION_SPRAY | RESPIRATORY_TRACT | 3 refills | Status: DC | PRN

## 2017-03-31 MED ORDER — FLUTICASONE PROPIONATE 50 MCG/ACT NA SUSP: 1.0000 | Freq: Every day | NASAL | 5 refills | Status: DC

## 2017-03-31 MED ORDER — TRIAMCINOLONE ACETONIDE 0.1 % EX OINT: 1.0000 "application " | TOPICAL_OINTMENT | Freq: Two times a day (BID) | CUTANEOUS | 5 refills | Status: DC

## 2017-03-31 MED ORDER — BUDESONIDE-FORMOTEROL FUMARATE 160-4.5 MCG/ACT IN AERO: 2.0000 | INHALATION_SPRAY | Freq: Two times a day (BID) | RESPIRATORY_TRACT | 5 refills | Status: DC

## 2017-03-31 NOTE — Patient Instructions (Addendum)
1. Moderate persistent asthma, uncomplicated - Lung testing showed evidence of uncontrolled asthma, and there was improvement with albuterol nebulizer treatment.  - We will start Symbicort 160/4.5 to help with your breathing. - Symbicort contains a long acting form of albuterol and an inhaled steroid to help control inflammation in the lungs. - Daily controller medication(s): Symbicort 160/4.5 two puffs twice daily with spacer - Rescue medications: ProAir 4 puffs every 4-6 hours as needed - Asthma control goals:  * Full participation in all desired activities (may need albuterol before activity) * Albuterol use two time or less a week on average (not counting use with activity) * Cough interfering with sleep two time or less a month * Oral steroids no more than once a year * No hospitalizations  2. Perennial allergic rhinitis (dust mites)  - Testing was positive to dust mites. - Avoidance measures provided. - Start fluticasone nasal spray one spray per nostril daily.  - Start Zyrtec (cetirizine) 10mg  as needed for breakthrough symptoms.   3. Rash - I am unsure of the cause of the rash, but it could be secondary to dust mites. - Testing for the most common foods was negative (peanut, tree nut, soy, fish mix, shellfish mix, wheat, milk, egg). - In the future, use triamcinolone 0.1% ointment twice daily as needed for break outs.  - Take a picture of the rash so that we can more fully evaluate it.   4. Return in about 3 months (around 07/01/2017).  Please inform us of any Emergency Department visits, hospitalizations, or changes in symptoms. Call us before going to the ED for breathing or allergy symptoms since we might be able to fit you in for a sick visit. Feel free to contact us anytime with any questions, problems, or concerns.  It was a pleasure to meet you and your family today! Happy summer!   Websites that have reliable patient information: 1. American Academy of Asthma, Allergy,  and Immunology: www.aaaai.org 2. Food Allergy Research and Education (FARE): foodallergy.org 3. Mothers of Asthmatics: http://www.asthmacommunitynetwork.org 4. American College of Allergy, Asthma, and Immunology: www.acaai.org  Control of House Dust Mite Allergen    House dust mites play a major role in allergic asthma and rhinitis.  They occur in environments with high humidity wherever human skin, the food for dust mites is found. High levels have been detected in dust obtained from mattresses, pillows, carpets, upholstered furniture, bed covers, clothes and soft toys.  The principal allergen of the house dust mite is found in its feces.  A gram of dust may contain 1,000 mites and 250,000 fecal particles.  Mite antigen is easily measured in the air during house cleaning activities.    1. Encase mattresses, including the box spring, and pillow, in an air tight cover.  Seal the zipper end of the encased mattresses with wide adhesive tape. 2. Wash the bedding in water of 130 degrees Farenheit weekly.  Avoid cotton comforters/quilts and flannel bedding: the most ideal bed covering is the dacron comforter. 3. Remove all upholstered furniture from the bedroom. 4. Remove carpets, carpet padding, rugs, and non-washable window drapes from the bedroom.  Wash drapes weekly or use plastic window coverings. 5. Remove all non-washable stuffed toys from the bedroom.  Wash stuffed toys weekly. 6. Have the room cleaned frequently with a vacuum cleaner and a damp dust-mop.  The patient should not be in a room which is being cleaned and should wait 1 hour after cleaning before going into the room.  7. Close and seal all heating outlets in the bedroom.  Otherwise, the room will become filled with dust-laden air.  An electric heater can be used to heat the room. 8. Reduce indoor humidity to less than 50%.  Do not use a humidifier.

## 2017-03-31 NOTE — Progress Notes (Signed)
NEW PATIENT  Date of Service/Encounter:  03/31/17  Referring provider: Nolene Ebbs, MD   Assessment:   Moderate persistent asthma, uncomplicated  Perennial allergic rhinitis (dust mite)  Rash - unknown etillogy    Asthma Reportables:  Severity: moderate persistent  Risk: low Control: not well controlled   Plan/Recommendations:   1. Moderate persistent asthma, uncomplicated - Lung testing showed evidence of uncontrolled asthma, and there was improvement with albuterol nebulizer treatment.  - We will start Symbicort 160/4.5 to help with your breathing. - Symbicort contains a long acting form of albuterol and an inhaled steroid to help control inflammation in the lungs. - Daily controller medication(s): Symbicort 160/4.5 two puffs twice daily with spacer - Rescue medications: ProAir 4 puffs every 4-6 hours as needed - Asthma control goals:  * Full participation in all desired activities (may need albuterol before activity) * Albuterol use two time or less a week on average (not counting use with activity) * Cough interfering with sleep two time or less a month * Oral steroids no more than once a year * No hospitalizations  2. Perennial allergic rhinitis (dust mites)  - Testing was positive to dust mites. - Avoidance measures provided. - Start fluticasone nasal spray one spray per nostril daily.  - Start Zyrtec (cetirizine) 37m as needed for breakthrough symptoms.   3. Rash - ? urticaria - I am unsure of the cause of the rash, but it could be secondary to dust mites. - Testing for the most common foods was negative (peanut, tree nut, soy, fish mix, shellfish mix, wheat, milk, egg). - In the future, use triamcinolone 0.1% ointment twice daily as needed for break outs.  - Take a picture of the rash so that we can more fully evaluate it.   4. Return in about 3 months (around 07/01/2017).   Subjective:   Rachel ISHLERis a 51y.o. female presenting today for  evaluation of  Chief Complaint  Patient presents with  . Allergy Testing    Rachel Robertson has a history of the following: Patient Active Problem List   Diagnosis Date Noted  . Carotid stenosis 06/22/2015  . Essential hypertension 06/15/2015  . DIABETES MELLITUS, TYPE II 05/08/2007  . ALLERGIC RHINITIS 05/08/2007  . LOW BACK PAIN, CHRONIC 05/08/2007  . SHOULDER IMPINGEMENT SYNDROME 05/08/2007    History obtained from: chart review and patient with her son serving as an interpreter.  Rachel Robertson was referred by Rachel Ebbs MD.     HLovellais a 51y.o. female presenting for evaluation of a rash. The history is rather difficult to obtain because the patient does not speak very good [place] her son is not the best interpreter. However, it seems that she has had a rash that has lasted around 5 weeks. She does not have pictures of the rash, which has since resolved. She is not able to fully describe the rash. It was quite pruritic and resolved with a steroid taper. It started around Ramadan at 3 in the afternoon. She was fasting during this time, therefore she does not think that foods were related. She is able to tolerate all of the major food allergens without a problem, although she does not have a lot of soy in her diet. She had no systemic symptoms from this, including shortness of breath, throat swelling, stomach pain, or diarrhea. Upon resolution, and left normal skin.   She does have a history of allergic rhinitis type symptoms. She also  has ocular symptoms and reports a "hazy smoky" appearance to her vision. She has a nose spray, but does not remember the name. It does not seem to matter since she does not use it anyway. She does take cetirizine daily, but last took around 1 month ago. She has never been allergy tested. She has lived in this area for 22 years. She does report quite a bit of sinus infections, although it does not seem that she obtains antibiotics for these. Her symptoms  remained stable throughout the year, but do worsen sometimes during the spring.  She has no history of diagnosed asthma. However, she does have an inhaler that was given to her years ago for a coughing episode. Review of her pulmonary symptoms reveals that she does cough on a daily basis. She also coughs at night. They do not seem to be any particular triggers for the cough, but she has just gotten used to it over the years. She has never been hospitalized for her breathing and has never been to the ER for her breathing.  Otherwise, there is no history of other atopic diseases, including drug allergies, food allergies, stinging insect allergies, or urticaria. There is no significant infectious history. Vaccinations are up to date.    Past Medical History: Patient Active Problem List   Diagnosis Date Noted  . Carotid stenosis 06/22/2015  . Essential hypertension 06/15/2015  . DIABETES MELLITUS, TYPE II 05/08/2007  . ALLERGIC RHINITIS 05/08/2007  . LOW BACK PAIN, CHRONIC 05/08/2007  . SHOULDER IMPINGEMENT SYNDROME 05/08/2007    Medication List:  Allergies as of 03/31/2017      Reactions   Metformin    REACTION: GI intolerance      Medication List       Accurate as of 03/31/17 11:59 PM. Always use your most recent med list.          albuterol 108 (90 Base) MCG/ACT inhaler Commonly known as:  PROAIR HFA Inhale 2 puffs into the lungs every 4 (four) hours as needed for wheezing or shortness of breath.   B-D ULTRAFINE III SHORT PEN 31G X 8 MM Misc Generic drug:  Insulin Pen Needle ADMINISTER INSULIN TID   budesonide-formoterol 160-4.5 MCG/ACT inhaler Commonly known as:  SYMBICORT Inhale 2 puffs into the lungs 2 (two) times daily.   cetirizine 10 MG tablet Commonly known as:  ZYRTEC TK 1 T PO QD PRN   cyanocobalamin 1000 MCG/ML injection Commonly known as:  (VITAMIN B-12) INJECT 1 ML IM EACH MONTH   cyclobenzaprine 10 MG tablet Commonly known as:  FLEXERIL Take 1 tablet  (10 mg total) by mouth 2 (two) times daily as needed for muscle spasms.   dicyclomine 10 MG capsule Commonly known as:  BENTYL Take 10 mg by mouth 2 (two) times daily as needed for spasms (gas).   fluticasone 50 MCG/ACT nasal spray Commonly known as:  FLONASE Place 1 spray into both nostrils daily.   glimepiride 4 MG tablet Commonly known as:  AMARYL Take 4 mg by mouth 2 (two) times daily.   hydrOXYzine 25 MG tablet Commonly known as:  ATARAX/VISTARIL TK 1 T PO  BID PRN FOR ITCHING   ibuprofen 800 MG tablet Commonly known as:  ADVIL,MOTRIN Take 800 mg by mouth every 8 (eight) hours as needed for mild pain.   LEVEMIR FLEXPEN 100 UNIT/ML Pen Generic drug:  Insulin Detemir Inject 20 Units into the skin daily at 10 pm.   losartan 25 MG tablet Commonly known as:  COZAAR Take 25 mg by mouth daily.   ondansetron 4 MG tablet Commonly known as:  ZOFRAN Take 1 tablet (4 mg total) by mouth every 6 (six) hours.   pantoprazole 40 MG tablet Commonly known as:  PROTONIX Take 40 mg by mouth daily.   PATADAY 0.2 % Soln Generic drug:  Olopatadine HCl INSTILL 1 DROP IN EACH EYE QD PRN   simvastatin 10 MG tablet Commonly known as:  ZOCOR TK 1 T PO HS   sitaGLIPtin 100 MG tablet Commonly known as:  JANUVIA Take 100 mg by mouth daily.   triamcinolone ointment 0.1 % Commonly known as:  KENALOG Apply 1 application topically 2 (two) times daily.   Vitamin D (Ergocalciferol) 50000 units Caps capsule Commonly known as:  DRISDOL Take 50,000 Units by mouth every 7 (seven) days. Every Monday       Birth History: non-contributory. Born at term without complications.   Developmental History: Srihitha has met all milestones on time. She has required no speech therapy, occupational therapy, or physical therapy.   Past Surgical History: Past Surgical History:  Procedure Laterality Date  . SHOULDER SURGERY       Family History: Family History  Problem Relation Age of Onset  .  Diabetes Mother   . Hypertension Mother   . Diabetes Sister   . Hypertension Sister   . Hypertension Maternal Grandmother   . Diabetes Maternal Grandmother   . Hypertension Maternal Grandfather   . Diabetes Maternal Grandfather   . Hypertension Paternal Grandmother   . Diabetes Paternal Grandmother   . Hypertension Paternal Grandfather   . Diabetes Paternal Grandfather      Social History: Ketra lives at home with her family. She lives in a house that is 53 years old. There is wood in the main living areas and carpeting in the bedrooms. They have electric heating and central cooling. There are no animals inside or outside of the home. He does not have dust mite coverings on her bedding. There is no tobacco exposure. She currently is a housewife.     Review of Systems: a 14-point review of systems is pertinent for what is mentioned in HPI.  Otherwise, all other systems were negative. Constitutional: negative other than that listed in the HPI Eyes: negative other than that listed in the HPI Ears, nose, mouth, throat, and face: negative other than that listed in the HPI Respiratory: negative other than that listed in the HPI Cardiovascular: negative other than that listed in the HPI Gastrointestinal: negative other than that listed in the HPI Genitourinary: negative other than that listed in the HPI Integument: negative other than that listed in the HPI Hematologic: negative other than that listed in the HPI Musculoskeletal: negative other than that listed in the HPI Neurological: negative other than that listed in the HPI Allergy/Immunologic: negative other than that listed in the HPI    Objective:   Blood pressure 130/70, pulse 71, temperature 98.6 F (37 C), temperature source Oral, resp. rate 16, height 5' 1.81" (1.57 m), weight 172 lb 3.2 oz (78.1 kg), last menstrual period 02/16/2012, SpO2 97 %. Body mass index is 31.69 kg/m.   Physical Exam:  General: Alert,  interactive, in no acute distress. Friendly obese female.  Eyes: No conjunctival injection present on the right, No conjunctival injection present on the left, PERRL bilaterally, No discharge on the right, No discharge on the left and No Horner-Trantas dots present Ears: Right TM pearly gray with normal light reflex, Left TM  pearly gray with normal light reflex, Right TM intact without perforation and Left TM intact without perforation.  Nose/Throat: External nose within normal limits and septum midline, turbinates edematous and pale with clear discharge, post-pharynx erythematous without cobblestoning in the posterior oropharynx. Tonsils 2+ without exudates Neck: Supple without thyromegaly.  Adenopathy: Shoddy bilateral anterior cervical lymphadenopathy. and No enlarged lymph nodes appreciated in the occipital, axillary, epitrochlear, inguinal, or popliteal regions. Lungs: Clear to auscultation without wheezing, rhonchi or rales. No increased work of breathing. CV: Normal S1/S2, no murmurs. Capillary refill <2 seconds.  Abdomen: Nondistended, nontender. No guarding or rebound tenderness. Bowel sounds present in all fields and hypoactive  Skin: Warm and dry, without lesions or rashes. Extremities:  No clubbing, cyanosis or edema. Neuro:   Grossly intact. No focal deficits appreciated. Responsive to questions.  Diagnostic studies:   Spirometry: results abnormal (FEV1: 1.04/45%, FVC: 1.63/58%, FEV1/FVC: 63%).    Spirometry consistent with mixed obstructive and restrictive disease. Albuterol/Atrovent nebulizer treatment given in clinic with significant improvement.  Allergy Studies:   Indoor/Outdoor Percutaneous Adult Environmental Panel: positive to Df mite. Otherwise negative with adequate controls.  Most Common Foods Panel (peanut, cashew, soy, fish mix, shellfish mix, wheat, milk, egg): negative to all with adequate controls     Salvatore Marvel, MD [facility]

## 2017-07-07 ENCOUNTER — Ambulatory Visit: Payer: Medicaid Other | Admitting: Allergy & Immunology

## 2017-07-07 DIAGNOSIS — J309 Allergic rhinitis, unspecified: Secondary | ICD-10-CM

## 2017-12-14 MED FILL — PANTOPRAZOLE SOD DR 40 MG T: 40 | 30 days supply | Qty: 60 | Fill #0

## 2017-12-14 MED FILL — IBUPROFEN 600 MG TABLET: 600 | 30 days supply | Qty: 60 | Fill #0

## 2017-12-14 MED FILL — LOSARTAN POTASSIUM 25 MG TA: 25 | 30 days supply | Qty: 30 | Fill #0

## 2017-12-14 MED FILL — JANUVIA 100 MG TABLET: 100 | 30 days supply | Qty: 30 | Fill #0

## 2017-12-14 MED FILL — GLIMEPIRIDE 4 MG TABLET: 4 | 30 days supply | Qty: 60 | Fill #0

## 2017-12-14 MED FILL — OLOPATADINE HCL 0.2 % SOLN: 0.2 | 18 days supply | Qty: 3 | Fill #0

## 2017-12-14 MED FILL — VIT D2 1.25 MG (50,000 UNIT: 1.25 MG | 28 days supply | Qty: 4 | Fill #0

## 2017-12-14 MED FILL — SIMVASTATIN 10 MG TABLET: 10 | 30 days supply | Qty: 30 | Fill #0

## 2017-12-14 MED FILL — metFORMIN HCL 500 MG TABS: 500 | 30 days supply | Qty: 60 | Fill #0

## 2017-12-14 MED FILL — DICYCLOMINE 10 MG CAPSULE: 10 | 30 days supply | Qty: 60 | Fill #0

## 2017-12-14 MED FILL — LEVEMIR FLEXTOUCH 100 UNITS: 100 | 15 days supply | Qty: 3 | Fill #0

## 2017-12-28 MED FILL — LANTUS SOLOSTAR 100 UNITS/M: 100 | 7 days supply | Qty: 3 | Fill #0

## 2018-01-17 MED FILL — GLIMEPIRIDE 4 MG TABS: 4 | 30 days supply | Qty: 60 | Fill #1

## 2018-01-17 MED FILL — LOSARTAN POTASSIUM 25 MG TA: 25 | 30 days supply | Qty: 30 | Fill #1

## 2018-01-17 MED FILL — IBUPROFEN 600 MG TABLET: 600 | 30 days supply | Qty: 60 | Fill #1

## 2018-01-17 MED FILL — metFORMIN HCL 500 MG TABS: 500 | 30 days supply | Qty: 60 | Fill #1

## 2018-01-17 MED FILL — SIMVASTATIN 10 MG TABLET: 10 | 30 days supply | Qty: 30 | Fill #1

## 2018-01-17 MED FILL — DICYCLOMINE 10 MG CAPSULE: 10 | 30 days supply | Qty: 60 | Fill #1

## 2018-01-17 MED FILL — VIT D2 1.25 MG (50,000 UNIT: 1.25 MG | 28 days supply | Qty: 4 | Fill #1

## 2018-01-17 MED FILL — PANTOPRAZOLE SOD DR 40 MG T: 40 | 30 days supply | Qty: 60 | Fill #1

## 2018-01-17 MED FILL — !JANUVIA 100MG TABLET: 100 | 30 days supply | Qty: 30 | Fill #1

## 2018-02-02 ENCOUNTER — Ambulatory Visit (HOSPITAL_COMMUNITY)
Admission: EM | Admit: 2018-02-02 | Discharge: 2018-02-02 | Disposition: A | Discharge status: Home / Self Care | Payer: Self-pay | Attending: Family Medicine | Admitting: Family Medicine

## 2018-02-02 ENCOUNTER — Encounter (HOSPITAL_COMMUNITY): Payer: Self-pay | Admitting: Emergency Medicine

## 2018-02-02 ENCOUNTER — Other Ambulatory Visit: Payer: Self-pay

## 2018-02-02 DIAGNOSIS — M791 Myalgia, unspecified site: Secondary | ICD-10-CM

## 2018-02-02 DIAGNOSIS — M7918 Myalgia, other site: Secondary | ICD-10-CM

## 2018-02-02 DIAGNOSIS — J4540 Moderate persistent asthma, uncomplicated: Secondary | ICD-10-CM

## 2018-02-02 DIAGNOSIS — G8929 Other chronic pain: Secondary | ICD-10-CM

## 2018-02-02 MED ORDER — ALBUTEROL SULFATE HFA 108 (90 BASE) MCG/ACT IN AERS: 2.0000 | INHALATION_SPRAY | RESPIRATORY_TRACT | 0 refills | Status: DC | PRN

## 2018-02-02 MED ORDER — GABAPENTIN 100 MG PO CAPS: 100.0000 mg | ORAL_CAPSULE | Freq: Three times a day (TID) | ORAL | 0 refills | Status: DC

## 2018-02-02 MED FILL — GABAPENTIN 100 MG CAPSULE: 100 | 30 days supply | Qty: 90 | Fill #0

## 2018-02-02 MED FILL — ALBUTEROL SULFATE HFA 108 (: 108 (90 BAS | 16 days supply | Qty: 18 | Fill #0

## 2018-02-02 NOTE — ED Triage Notes (Signed)
Headache, chest, back ache, complains of cough, complains of fever

## 2018-02-02 NOTE — Discharge Instructions (Signed)
Continue using your daily inhaler, Symbicort In addition use albuterol for shortness of breath or coughing  You have wide spread pain across your muscles.  This sometimes can be fibromyalgia.  Stretching and exercise will help fibromyalgia.  Gabapentin is prescribed to take 3 times a day.  Follow-up with your primary care doctor for further discussion.

## 2018-02-02 NOTE — ED Provider Notes (Signed)
[facility]    CSN: [id number] Arrival date & time: 02/02/18  1329     History   Chief Complaint Chief Complaint  Patient presents with  . URI    HPI Rachel Robertson is a 52 y.o. female.   HPI  Patient is here for 2 separate complaints.  First is increased cough.  She has known asthma.  She has a cough variant asthma was seen by asthma and allergy last year.  She was put on Symbicort with a rescue inhaler of albuterol.  For some reason she does not have albuterol, only her Symbicort right now.  She uses a Symbicort daily.  She has periods of coughing still, mostly at night.  She is reminded that she needs to use the albuterol for coughing or shortness of breath.  She is given a refill of her albuterol.  No sputum, no congestion, no fever or chills.  She does not notice a lot of coughing during the daytime.  I also recommended she humidify her bedroom. She is here with her son who is interpreting for her.  His English is good.  He is a Civil engineer, contracting. Her other complaint is of diffuse pain.  She complains of pain all across her back, upper back neck and shoulders, and lower back.  She complains that most areas that I touch are painful including her sternoclavicular joints, lateral epicondyles, trochanteric bursa, medial knee, and areas that would be fibromyalgia tender points.  She also has tenderness to palpation of the muscle bodies within the upper body of the trapezius.  I explained to her that her chronic pain complaint is not one that I can really work-up at the urgent care center.  She is already on ibuprofen 800 3 times daily.  She is taken Flexeril without assistance.  We will give her low-dose of gabapentin, and recommend she follow-up with her PCP.  Past Medical History:  Diagnosis Date  . Back pain   . Carotid stenosis 06/22/2015   1-39% bilateral carotid artery stenosis 06/2015.  . Diabetes mellitus   . Essential hypertension 06/15/2015  . GERD (gastroesophageal  reflux disease)   . Hypertension     Patient Active Problem List   Diagnosis Date Noted  . Carotid stenosis 06/22/2015  . Essential hypertension 06/15/2015  . DIABETES MELLITUS, TYPE II 05/08/2007  . ALLERGIC RHINITIS 05/08/2007  . LOW BACK PAIN, CHRONIC 05/08/2007  . SHOULDER IMPINGEMENT SYNDROME 05/08/2007    Past Surgical History:  Procedure Laterality Date  . SHOULDER SURGERY      OB History   None      Home Medications    Prior to Admission medications   Medication Sig Start Date End Date Taking? Authorizing Provider  cetirizine (ZYRTEC) 10 MG tablet TK 1 T PO QD PRN 03/27/17  Yes [provider]  dicyclomine (BENTYL) 10 MG capsule Take 10 mg by mouth 2 (two) times daily as needed for spasms (gas).   Yes [provider]  glimepiride (AMARYL) 4 MG tablet Take 4 mg by mouth 2 (two) times daily.   Yes [provider]  ibuprofen (ADVIL,MOTRIN) 800 MG tablet Take 800 mg by mouth every 8 (eight) hours as needed for mild pain.   Yes [provider]  Insulin Detemir (LEVEMIR FLEXPEN) 100 UNIT/ML Pen Inject 20 Units into the skin daily at 10 pm.   Yes [provider]  losartan (COZAAR) 25 MG tablet Take 25 mg by mouth daily.   Yes  [provider]  metFORMIN (GLUMETZA) 500 MG (MOD) 24 hr tablet Take 500 mg by mouth daily with breakfast.   Yes [provider]  pantoprazole (PROTONIX) 40 MG tablet Take 40 mg by mouth daily.   Yes [provider]  PATADAY 0.2 % SOLN INSTILL 1 DROP IN EACH EYE QD PRN 03/03/17  Yes [provider]  simvastatin (ZOCOR) 10 MG tablet TK 1 T PO HS 03/21/17  Yes [provider]  sitaGLIPtin (JANUVIA) 100 MG tablet Take 100 mg by mouth daily.   Yes [provider]  Vitamin D, Ergocalciferol, (DRISDOL) 50000 UNITS CAPS capsule Take 50,000 Units by mouth every 7 (seven) days. Every Monday   Yes [provider]  albuterol (PROAIR HFA) 108 (90 Base) MCG/ACT  inhaler Inhale 2 puffs into the lungs every 4 (four) hours as needed for wheezing or shortness of breath. 02/02/18   Eustace [name], MD  B-D ULTRAFINE III SHORT PEN 31G X 8 MM MISC ADMINISTER INSULIN TID 01/26/17   [provider]  budesonide-formoterol (SYMBICORT) 160-4.5 MCG/ACT inhaler Inhale 2 puffs into the lungs 2 (two) times daily. 03/31/17   Alfonse Spruce, MD  cyanocobalamin (,VITAMIN B-12,) 1000 MCG/ML injection INJECT 1 ML IM [facility] MONTH 03/08/17   [provider]  fluticasone (FLONASE) 50 MCG/ACT nasal spray Place 1 spray into both nostrils daily. 03/31/17   Alfonse Spruce, MD  gabapentin (NEURONTIN) 100 MG capsule Take 1 capsule (100 mg total) by mouth 3 (three) times daily. 02/02/18   Eustace [name], MD  hydrOXYzine (ATARAX/VISTARIL) 25 MG tablet TK 1 T PO  BID PRN FOR ITCHING 01/31/17   [provider]  triamcinolone ointment (KENALOG) 0.1 % Apply 1 application topically 2 (two) times daily. 03/31/17   Alfonse Spruce, MD  enoxaparin (LOVENOX) 30 MG/0.3ML injection Inject 0.3 mLs (30 mg total) into the skin every 12 (twelve) hours. 03/12/12 03/12/12  Clemetine Marker, MD    Family History Family History  Problem Relation Age of Onset  . Diabetes Mother   . Hypertension Mother   . Diabetes Sister   . Hypertension Sister   . Hypertension Maternal Grandmother   . Diabetes Maternal Grandmother   . Hypertension Maternal Grandfather   . Diabetes Maternal Grandfather   . Hypertension Paternal Grandmother   . Diabetes Paternal Grandmother   . Hypertension Paternal Grandfather   . Diabetes Paternal Grandfather     Social History Social History   Tobacco Use  . Smoking status: Never Smoker  . Smokeless tobacco: Never Used  Substance Use Topics  . Alcohol use: No  . Drug use: No     Allergies   Metformin   Review of Systems Review of Systems  Constitutional: Negative for chills, fatigue and fever.  HENT: Negative for  congestion, ear pain, postnasal drip and sore throat.   Eyes: Negative for pain, itching and visual disturbance.  Respiratory: Positive for cough. Negative for shortness of breath and wheezing.   Cardiovascular: Negative for chest pain and palpitations.  Gastrointestinal: Negative for abdominal pain and vomiting.  Genitourinary: Negative for dysuria and hematuria.  Musculoskeletal: Positive for back pain and myalgias. Negative for arthralgias.  Skin: Negative for color change and rash.  Neurological: Negative for dizziness and headaches.  Psychiatric/Behavioral: Positive for sleep disturbance.  All other systems reviewed and are negative.    Physical Exam Triage Vital Signs ED Triage Vitals  Enc Vitals Group     BP 02/02/18 1359 (!) 143/70  Pulse Rate 02/02/18 1359 77     Resp 02/02/18 1359 (!) 22     Temp 02/02/18 1359 98 F (36.7 C)     Temp Source 02/02/18 1359 Oral     SpO2 02/02/18 1359 100 %     Weight --      Height --      Head Circumference --      Peak Flow --      Pain Score 02/02/18 1350 10     Pain Loc --      Pain Edu? --      Excl. in GC? --    No data found.  Updated Vital Signs BP (!) 143/70 (BP Location: Right Arm)   Pulse 77   Temp 98 F (36.7 C) (Oral)   Resp (!) 22   LMP 02/16/2012   SpO2 100%   Visual Acuity Right Eye Distance:   Left Eye Distance:   Bilateral Distance:    Right Eye Near:   Left Eye Near:    Bilateral Near:     Physical Exam  Constitutional: She appears well-developed and well-nourished. No distress.  HENT:  Head: Normocephalic and atraumatic.  Right Ear: External ear normal.  Left Ear: External ear normal.  Nose: Nose normal.  Mouth/Throat: Oropharynx is clear and moist.  Eyes: Pupils are equal, round, and reactive to light. Conjunctivae are normal.  Neck: Normal range of motion. Neck supple.  Cardiovascular: Normal rate and regular rhythm.  No murmur heard. ?  Click  Pulmonary/Chest: Effort normal and  breath sounds normal. No respiratory distress. She has no wheezes.  Abdominal: Soft. She exhibits no distension. There is no tenderness.  Musculoskeletal: She exhibits no edema.  Tenderness as described in the HPI over the muscle bodies of the upper trapezius, cervical spine.  Also point tenderness over the medial scapula, SI joints, claviculosternal joints, lateral epicondyles, greater trochanters, and knees  Lymphadenopathy:    She has no cervical adenopathy.  Neurological: She is alert.  Skin: Skin is warm and dry.  Psychiatric: She has a normal mood and affect.  Nursing note and vitals reviewed.    UC Treatments / Results  Labs (all labs ordered are listed, but only abnormal results are displayed) Labs Reviewed - No data to display  EKG None  Radiology No results found.  Procedures Procedures (including critical care time)  Medications Ordered in UC Medications - No data to display  Initial Impression / Assessment and Plan / UC Course  I have reviewed the triage vital signs and the nursing notes.  Pertinent labs & imaging results that were available during my care of the patient were reviewed by me and considered in my medical decision making (see chart for details).      Final Clinical Impressions(s) / UC Diagnoses   Final diagnoses:  Moderate persistent asthma without complication  Myofascial pain     Discharge Instructions     Continue using your daily inhaler, Symbicort In addition use albuterol for shortness of breath or coughing  You have wide spread pain across your muscles.  This sometimes can be fibromyalgia.  Stretching and exercise will help fibromyalgia.  Gabapentin is prescribed to take 3 times a day.  Follow-up with your primary care doctor for further discussion.   ED Prescriptions    Medication Sig Dispense Auth. Provider   albuterol (PROAIR HFA) 108 (90 Base) MCG/ACT inhaler Inhale 2 puffs into the lungs every 4 (four) hours as needed for  wheezing or  shortness of breath. 1 Inhaler Eustace [name], MD   gabapentin (NEURONTIN) 100 MG capsule Take 1 capsule (100 mg total) by mouth 3 (three) times daily. 90 capsule Eustace [name], MD     Controlled Substance Prescriptions [place] Controlled Substance Registry consulted? Not Applicable   Eustace [name], MD 02/02/18 [phone]

## 2018-02-16 MED FILL — IBUPROFEN 600 MG TABLET: 600 | 30 days supply | Qty: 60 | Fill #2

## 2018-02-16 MED FILL — metFORMIN HCL 500 MG TABS: 500 | 30 days supply | Qty: 60 | Fill #2

## 2018-02-16 MED FILL — LOSARTAN POTASSIUM 25 MG TA: 25 | 30 days supply | Qty: 30 | Fill #2

## 2018-02-16 MED FILL — GLIMEPIRIDE 4 MG TABS: 4 | 30 days supply | Qty: 60 | Fill #2

## 2018-02-16 MED FILL — VIT D2 1.25 MG (50,000 UNIT: 1.25 MG | 28 days supply | Qty: 4 | Fill #2

## 2018-02-16 MED FILL — SIMVASTATIN 10 MG TABLET: 10 | 30 days supply | Qty: 30 | Fill #2

## 2018-02-16 MED FILL — DICYCLOMINE 10 MG CAPSULE: 10 | 30 days supply | Qty: 60 | Fill #2

## 2018-02-16 MED FILL — PANTOPRAZOLE SOD DR 40 MG T: 40 | 30 days supply | Qty: 60 | Fill #2

## 2018-04-04 MED FILL — GLIMEPIRIDE 4 MG TABLET: 4 | 30 days supply | Qty: 60 | Fill #3

## 2018-04-04 MED FILL — PANTOPRAZOLE SOD DR 40 MG T: 40 | 30 days supply | Qty: 60 | Fill #3

## 2018-04-04 MED FILL — metFORMIN HCL 500 MG TABS: 500 | 30 days supply | Qty: 60 | Fill #3

## 2018-04-04 MED FILL — VIT D2 1.25 MG (50,000 UNIT: 1.25 MG | 28 days supply | Qty: 4 | Fill #3

## 2018-04-04 MED FILL — SIMVASTATIN 10 MG TABLET: 10 | 30 days supply | Qty: 30 | Fill #3

## 2018-04-04 MED FILL — IBUPROFEN 600 MG TABLET: 600 | 30 days supply | Qty: 60 | Fill #3

## 2018-04-04 MED FILL — LOSARTAN POTASSIUM 25 MG TA: 25 | 30 days supply | Qty: 30 | Fill #3

## 2018-05-08 MED FILL — LOSARTAN POTASSIUM 25 MG TA: 25 | 30 days supply | Qty: 30 | Fill #0

## 2018-05-08 MED FILL — SIMVASTATIN 10 MG TABLET: 10 | 30 days supply | Qty: 30 | Fill #0

## 2018-05-08 MED FILL — OLOPATADINE HCL 0.2% EYE DR: 0.2 | 25 days supply | Qty: 3 | Fill #0

## 2018-05-08 MED FILL — VIT D2 1.25 MG (50,000 UNIT: 1.25 MG | 28 days supply | Qty: 4 | Fill #0

## 2018-05-08 MED FILL — GLIMEPIRIDE 4 MG TABS: 4 | 30 days supply | Qty: 60 | Fill #0

## 2018-05-08 MED FILL — IBUPROFEN 600 MG TABLET: 600 | 30 days supply | Qty: 60 | Fill #0

## 2018-05-08 MED FILL — LANTUS SOLOSTAR 100 UNITS/M: 100 | 15 days supply | Qty: 6 | Fill #0

## 2018-05-08 MED FILL — DICYCLOMINE 10 MG CAPSULE: 10 | 30 days supply | Qty: 60 | Fill #0

## 2018-08-07 ENCOUNTER — Emergency Department (HOSPITAL_COMMUNITY)
Admission: EM | Admit: 2018-08-07 | Discharge: 2018-08-07 | Disposition: A | Discharge status: Home / Self Care | Payer: Medicaid Other | Attending: Emergency Medicine | Admitting: Emergency Medicine

## 2018-08-07 ENCOUNTER — Emergency Department (HOSPITAL_COMMUNITY): Payer: Medicaid Other

## 2018-08-07 ENCOUNTER — Encounter (HOSPITAL_COMMUNITY): Payer: Self-pay

## 2018-08-07 DIAGNOSIS — Z79899 Other long term (current) drug therapy: Secondary | ICD-10-CM | POA: Diagnosis not present

## 2018-08-07 DIAGNOSIS — Z794 Long term (current) use of insulin: Secondary | ICD-10-CM | POA: Insufficient documentation

## 2018-08-07 DIAGNOSIS — I10 Essential (primary) hypertension: Secondary | ICD-10-CM | POA: Diagnosis not present

## 2018-08-07 DIAGNOSIS — J9801 Acute bronchospasm: Secondary | ICD-10-CM

## 2018-08-07 DIAGNOSIS — E119 Type 2 diabetes mellitus without complications: Secondary | ICD-10-CM | POA: Insufficient documentation

## 2018-08-07 DIAGNOSIS — J45909 Unspecified asthma, uncomplicated: Secondary | ICD-10-CM | POA: Insufficient documentation

## 2018-08-07 DIAGNOSIS — R05 Cough: Secondary | ICD-10-CM | POA: Insufficient documentation

## 2018-08-07 MED ORDER — ALBUTEROL SULFATE (2.5 MG/3ML) 0.083% IN NEBU
5.0000 mg | INHALATION_SOLUTION | Freq: Once | RESPIRATORY_TRACT | Status: AC
  Administered 2018-08-07: 5 mg via RESPIRATORY_TRACT
  Filled 2018-08-07: qty 6

## 2018-08-07 MED ORDER — PREDNISONE 20 MG PO TABS
60.0000 mg | ORAL_TABLET | Freq: Once | ORAL | Status: AC
  Administered 2018-08-07: 60 mg via ORAL
  Filled 2018-08-07: qty 3

## 2018-08-07 MED ORDER — PREDNISONE 10 MG (21) PO TBPK: ORAL_TABLET | Freq: Every day | ORAL | 0 refills | Status: DC

## 2018-08-07 MED ORDER — HYDROCOD POLST-CPM POLST ER 10-8 MG/5ML PO SUER: 5.0000 mL | Freq: Two times a day (BID) | ORAL | 0 refills | Status: DC | PRN

## 2018-08-07 NOTE — ED Triage Notes (Signed)
Patient c/o of cough and shob X 1 week.  Ambulatory in triage.  A/Ox4.

## 2018-08-07 NOTE — ED Provider Notes (Signed)
[facility]-EMERGENCY DEPT Provider Note   CSN: [id number] Arrival date & time: 08/07/18  1617     History   Chief Complaint Chief Complaint  Patient presents with  . Cough  . Shortness of Breath    HPI Rachel Robertson is a 52 y.o. female.  This is a 52 year old female who has had several days of cough congestion and wheezing.  No fever chills.  No CHF or anginal type symptoms.  Does have history of asthma in the past.  Has used over-the-counter medications with limited relief.  No vomiting or diarrhea.  Denies any sore throat or ear pain.     Past Medical History:  Diagnosis Date  . Back pain   . Carotid stenosis 06/22/2015   1-39% bilateral carotid artery stenosis 06/2015.  . Diabetes mellitus   . Essential hypertension 06/15/2015  . GERD (gastroesophageal reflux disease)   . Hypertension     Patient Active Problem List   Diagnosis Date Noted  . Carotid stenosis 06/22/2015  . Essential hypertension 06/15/2015  . DIABETES MELLITUS, TYPE II 05/08/2007  . ALLERGIC RHINITIS 05/08/2007  . LOW BACK PAIN, CHRONIC 05/08/2007  . SHOULDER IMPINGEMENT SYNDROME 05/08/2007    Past Surgical History:  Procedure Laterality Date  . SHOULDER SURGERY       OB History   None      Home Medications    Prior to Admission medications   Medication Sig Start Date End Date Taking? Authorizing Provider  albuterol (PROAIR HFA) 108 (90 Base) MCG/ACT inhaler Inhale 2 puffs into the lungs every 4 (four) hours as needed for wheezing or shortness of breath. 02/02/18   Eustace [name], MD  B-D ULTRAFINE III SHORT PEN 31G X 8 MM MISC ADMINISTER INSULIN TID 01/26/17   [provider]  budesonide-formoterol (SYMBICORT) 160-4.5 MCG/ACT inhaler Inhale 2 puffs into the lungs 2 (two) times daily. 03/31/17   Alfonse Spruce, MD  cetirizine (ZYRTEC) 10 MG tablet TK 1 T PO QD PRN 03/27/17   [provider]  cyanocobalamin (,VITAMIN B-12,) 1000 MCG/ML  injection INJECT 1 ML IM [facility] MONTH 03/08/17   [provider]  dicyclomine (BENTYL) 10 MG capsule Take 10 mg by mouth 2 (two) times daily as needed for spasms (gas).    [provider]  fluticasone (FLONASE) 50 MCG/ACT nasal spray Place 1 spray into both nostrils daily. 03/31/17   Alfonse Spruce, MD  gabapentin (NEURONTIN) 100 MG capsule Take 1 capsule (100 mg total) by mouth 3 (three) times daily. 02/02/18   Eustace [name], MD  glimepiride (AMARYL) 4 MG tablet Take 4 mg by mouth 2 (two) times daily.    [provider]  hydrOXYzine (ATARAX/VISTARIL) 25 MG tablet TK 1 T PO  BID PRN FOR ITCHING 01/31/17   [provider]  ibuprofen (ADVIL,MOTRIN) 800 MG tablet Take 800 mg by mouth every 8 (eight) hours as needed for mild pain.    [provider]  Insulin Detemir (LEVEMIR FLEXPEN) 100 UNIT/ML Pen Inject 20 Units into the skin daily at 10 pm.    [provider]  losartan (COZAAR) 25 MG tablet Take 25 mg by mouth daily.    [provider]  metFORMIN (GLUMETZA) 500 MG (MOD) 24 hr tablet Take 500 mg by mouth daily with breakfast.    [provider]  pantoprazole (PROTONIX) 40 MG tablet Take 40 mg by mouth daily.    [provider]  PATADAY 0.2 % SOLN INSTILL  1 DROP IN [facility] QD PRN 03/03/17   [provider]  simvastatin (ZOCOR) 10 MG tablet TK 1 T PO HS 03/21/17   [provider]  sitaGLIPtin (JANUVIA) 100 MG tablet Take 100 mg by mouth daily.    [provider]  triamcinolone ointment (KENALOG) 0.1 % Apply 1 application topically 2 (two) times daily. 03/31/17   Alfonse Spruce, MD  Vitamin D, Ergocalciferol, (DRISDOL) 50000 UNITS CAPS capsule Take 50,000 Units by mouth every 7 (seven) days. Every Monday    [provider]  enoxaparin (LOVENOX) 30 MG/0.3ML injection Inject 0.3 mLs (30 mg total) into the skin every 12 (twelve) hours. 03/12/12 03/12/12  Clemetine Marker, MD    Family  History Family History  Problem Relation Age of Onset  . Diabetes Mother   . Hypertension Mother   . Diabetes Sister   . Hypertension Sister   . Hypertension Maternal Grandmother   . Diabetes Maternal Grandmother   . Hypertension Maternal Grandfather   . Diabetes Maternal Grandfather   . Hypertension Paternal Grandmother   . Diabetes Paternal Grandmother   . Hypertension Paternal Grandfather   . Diabetes Paternal Grandfather     Social History Social History   Tobacco Use  . Smoking status: Never Smoker  . Smokeless tobacco: Never Used  Substance Use Topics  . Alcohol use: No  . Drug use: No     Allergies   Metformin   Review of Systems Review of Systems  All other systems reviewed and are negative.    Physical Exam Updated Vital Signs BP (!) 176/119 (BP Location: Left Arm)   Pulse 95   Temp 98.3 F (36.8 C) (Oral)   Resp 20   LMP 02/16/2012   SpO2 97%   Physical Exam  Constitutional: She is oriented to person, place, and time. She appears well-developed and well-nourished.  Non-toxic appearance. No distress.  HENT:  Head: Normocephalic and atraumatic.  Eyes: Pupils are equal, round, and reactive to light. Conjunctivae, EOM and lids are normal.  Neck: Normal range of motion. Neck supple. No tracheal deviation present. No thyroid mass present.  Cardiovascular: Normal rate, regular rhythm and normal heart sounds. Exam reveals no gallop.  No murmur heard. Pulmonary/Chest: Effort normal. No stridor. No respiratory distress. She has decreased breath sounds in the right lower field and the left lower field. She has wheezes. She has no rhonchi. She has no rales.  Abdominal: Soft. Normal appearance and bowel sounds are normal. She exhibits no distension. There is no tenderness. There is no rebound and no CVA tenderness.  Musculoskeletal: Normal range of motion. She exhibits no edema or tenderness.  Neurological: She is alert and oriented to person, place, and time.  She has normal strength. No cranial nerve deficit or sensory deficit. GCS eye subscore is 4. GCS verbal subscore is 5. GCS motor subscore is 6.  Skin: Skin is warm and dry. No abrasion and no rash noted.  Psychiatric: She has a normal mood and affect. Her speech is normal and behavior is normal.  Nursing note and vitals reviewed.    ED Treatments / Results  Labs (all labs ordered are listed, but only abnormal results are displayed) Labs Reviewed - No data to display  EKG EKG Interpretation  Date/Time:  Monday August 07 2018 17:09:36 EST Ventricular Rate:  77 PR Interval:    QRS Duration: 91 QT Interval:  401 QTC Calculation: 454 R Axis:   90 Text Interpretation:  Sinus rhythm Atrial  premature complex Borderline right axis deviation Baseline wander in lead(s) I III aVL Confirmed by Lorre NickAllen, Neida Ellegood ([id number]) on 08/07/2018 9:49:54 PM   Radiology Dg Chest 2 View  Result Date: 08/07/2018 CLINICAL DATA:  Progressive cough and shortness of breath one week. EXAM: CHEST - 2 VIEW COMPARISON:  Two-view chest x-ray 07/01/2015 FINDINGS: The heart size and mediastinal contours are within normal limits. Both lungs are clear. The visualized skeletal structures are unremarkable. IMPRESSION: Negative two view chest x-ray Electronically Signed   By: Marin Robertshristopher  Mattern M.D.   On: 08/07/2018 18:29    Procedures Procedures (including critical care time)  Medications Ordered in ED Medications  predniSONE (DELTASONE) tablet 60 mg (has no administration in time range)  albuterol (PROVENTIL) (2.5 MG/3ML) 0.083% nebulizer solution 5 mg (5 mg Nebulization Given 08/07/18 1701)     Initial Impression / Assessment and Plan / ED Course  I have reviewed the triage vital signs and the nursing notes.  Pertinent labs & imaging results that were available during my care of the patient were reviewed by me and considered in my medical decision making (see chart for details).     Patient given albuterol  here and felt much better.  Will be given dose of prednisone as well as a spacer for her home inhaler.  Encouraged to follow with her doctor we placed on steroid taper  Final Clinical Impressions(s) / ED Diagnoses   Final diagnoses:  None    ED Discharge Orders    None       Lorre NickAllen, Monee Dembeck, MD 08/07/18 2205

## 2018-08-07 NOTE — Discharge Instructions (Addendum)
You may also purchase some guaifenesin to help with the mucus.  Use 2 puffs of your inhaler every 4 hours for the next 2 to 3 days.  Follow-up with your doctor later this week if you are not better

## 2018-08-16 ENCOUNTER — Encounter (HOSPITAL_COMMUNITY): Payer: Self-pay | Admitting: Emergency Medicine

## 2018-08-16 ENCOUNTER — Emergency Department (HOSPITAL_COMMUNITY)
Admission: EM | Admit: 2018-08-16 | Discharge: 2018-08-16 | Disposition: A | Discharge status: Home / Self Care | Payer: Medicaid Other | Attending: Emergency Medicine | Admitting: Emergency Medicine

## 2018-08-16 ENCOUNTER — Emergency Department (HOSPITAL_COMMUNITY): Payer: Medicaid Other

## 2018-08-16 ENCOUNTER — Other Ambulatory Visit: Payer: Self-pay

## 2018-08-16 DIAGNOSIS — R1084 Generalized abdominal pain: Secondary | ICD-10-CM | POA: Diagnosis present

## 2018-08-16 DIAGNOSIS — R739 Hyperglycemia, unspecified: Secondary | ICD-10-CM

## 2018-08-16 DIAGNOSIS — R112 Nausea with vomiting, unspecified: Secondary | ICD-10-CM

## 2018-08-16 DIAGNOSIS — Z794 Long term (current) use of insulin: Secondary | ICD-10-CM | POA: Diagnosis not present

## 2018-08-16 DIAGNOSIS — E1165 Type 2 diabetes mellitus with hyperglycemia: Secondary | ICD-10-CM | POA: Insufficient documentation

## 2018-08-16 DIAGNOSIS — Z79899 Other long term (current) drug therapy: Secondary | ICD-10-CM | POA: Insufficient documentation

## 2018-08-16 DIAGNOSIS — K529 Noninfective gastroenteritis and colitis, unspecified: Secondary | ICD-10-CM | POA: Insufficient documentation

## 2018-08-16 LAB — URINALYSIS, ROUTINE W REFLEX MICROSCOPIC
BILIRUBIN URINE: NEGATIVE
Bacteria, UA: NONE SEEN
Hgb urine dipstick: NEGATIVE
KETONES UR: NEGATIVE mg/dL
LEUKOCYTES UA: NEGATIVE
Nitrite: NEGATIVE
PH: 5 (ref 5.0–8.0)
Protein, ur: NEGATIVE mg/dL
SPECIFIC GRAVITY, URINE: 1.029 (ref 1.005–1.030)

## 2018-08-16 LAB — COMPREHENSIVE METABOLIC PANEL
ALT: 22 U/L (ref 0–44)
AST: 20 U/L (ref 15–41)
Albumin: 4 g/dL (ref 3.5–5.0)
Alkaline Phosphatase: 109 U/L (ref 38–126)
Anion gap: 14 (ref 5–15)
BILIRUBIN TOTAL: 0.6 mg/dL (ref 0.3–1.2)
BUN: 13 mg/dL (ref 6–20)
CALCIUM: 9.3 mg/dL (ref 8.9–10.3)
CO2: 24 mmol/L (ref 22–32)
Chloride: 95 mmol/L — ABNORMAL LOW (ref 98–111)
Creatinine, Ser: 0.92 mg/dL (ref 0.44–1.00)
Glucose, Bld: 709 mg/dL (ref 70–99)
Potassium: 4.3 mmol/L (ref 3.5–5.1)
Sodium: 133 mmol/L — ABNORMAL LOW (ref 135–145)
TOTAL PROTEIN: 7.3 g/dL (ref 6.5–8.1)

## 2018-08-16 LAB — CBG MONITORING, ED
Glucose-Capillary: 525 mg/dL (ref 70–99)
Glucose-Capillary: 239 mg/dL — ABNORMAL HIGH (ref 70–99)

## 2018-08-16 LAB — CBC
HCT: 49.2 % — ABNORMAL HIGH (ref 36.0–46.0)
Hemoglobin: 15.5 g/dL — ABNORMAL HIGH (ref 12.0–15.0)
MCH: 24.6 pg — ABNORMAL LOW (ref 26.0–34.0)
MCHC: 31.5 g/dL (ref 30.0–36.0)
MCV: 78.2 fL — ABNORMAL LOW (ref 80.0–100.0)
NRBC: 0 % (ref 0.0–0.2)
PLATELETS: 233 10*3/uL (ref 150–400)
RBC: 6.29 MIL/uL — ABNORMAL HIGH (ref 3.87–5.11)
RDW: 14 % (ref 11.5–15.5)
WBC: 20.2 10*3/uL — AB (ref 4.0–10.5)

## 2018-08-16 LAB — LIPASE, BLOOD: LIPASE: 33 U/L (ref 11–51)

## 2018-08-16 MED ORDER — INSULIN ASPART 100 UNIT/ML IV SOLN
10.0000 [IU] | Freq: Once | INTRAVENOUS | Status: AC
  Administered 2018-08-16: 10 [IU] via INTRAVENOUS
  Filled 2018-08-16: qty 0.1

## 2018-08-16 MED ORDER — METRONIDAZOLE 500 MG PO TABS: 500.0000 mg | ORAL_TABLET | Freq: Three times a day (TID) | ORAL | 0 refills | Status: DC

## 2018-08-16 MED ORDER — METRONIDAZOLE 500 MG PO TABS
500.0000 mg | ORAL_TABLET | Freq: Once | ORAL | Status: AC
  Administered 2018-08-16: 500 mg via ORAL
  Filled 2018-08-16: qty 1

## 2018-08-16 MED ORDER — CIPROFLOXACIN HCL 500 MG PO TABS
500.0000 mg | ORAL_TABLET | Freq: Once | ORAL | Status: AC
  Administered 2018-08-16: 500 mg via ORAL
  Filled 2018-08-16: qty 1

## 2018-08-16 MED ORDER — ONDANSETRON HCL 4 MG PO TABS: 4.0000 mg | ORAL_TABLET | Freq: Four times a day (QID) | ORAL | 0 refills | Status: DC | PRN

## 2018-08-16 MED ORDER — SODIUM CHLORIDE (PF) 0.9 % IJ SOLN
INTRAMUSCULAR | Status: AC
  Administered 2018-08-16: 07:00:00
  Filled 2018-08-16: qty 50

## 2018-08-16 MED ORDER — SODIUM CHLORIDE 0.9 % IV BOLUS
1000.0000 mL | Freq: Once | INTRAVENOUS | Status: AC
  Administered 2018-08-16: 1000 mL via INTRAVENOUS

## 2018-08-16 MED ORDER — OXYCODONE-ACETAMINOPHEN 5-325 MG PO TABS: 1.0000 | ORAL_TABLET | ORAL | 0 refills | Status: DC | PRN

## 2018-08-16 MED ORDER — ONDANSETRON HCL 4 MG/2ML IJ SOLN
4.0000 mg | Freq: Once | INTRAMUSCULAR | Status: AC
  Administered 2018-08-16: 4 mg via INTRAVENOUS
  Filled 2018-08-16: qty 2

## 2018-08-16 MED ORDER — IOPAMIDOL (ISOVUE-300) INJECTION 61%
100.0000 mL | Freq: Once | INTRAVENOUS | Status: AC | PRN
  Administered 2018-08-16: 100 mL via INTRAVENOUS

## 2018-08-16 MED ORDER — IOPAMIDOL (ISOVUE-300) INJECTION 61%
INTRAVENOUS | Status: AC
  Filled 2018-08-16: qty 100

## 2018-08-16 MED ORDER — MORPHINE SULFATE (PF) 4 MG/ML IV SOLN
4.0000 mg | Freq: Once | INTRAVENOUS | Status: AC
  Administered 2018-08-16: 4 mg via INTRAVENOUS
  Filled 2018-08-16: qty 1

## 2018-08-16 MED ORDER — CIPROFLOXACIN HCL 500 MG PO TABS: 500.0000 mg | ORAL_TABLET | Freq: Two times a day (BID) | ORAL | 0 refills | Status: DC

## 2018-08-16 NOTE — ED Provider Notes (Signed)
[facility]-EMERGENCY DEPT Provider Note   CSN: [id number] Arrival date & time: 08/16/18  0034     History   Chief Complaint Chief Complaint  Patient presents with  . Abdominal Pain    HPI Rachel Robertson is a 52 y.o. female.  The history is provided by the patient.  Abdominal Pain    She has history of diabetes, hypertension and comes in complaining of generalized abdominal pain with associated nausea, vomiting, diarrhea.  She started getting sick last night and states that she has been having constant diarrhea every 15 minutes.  There is associated nausea and vomiting.  Abdominal pain is present and is generalized and tends to come in waves.  It is worse just before episodes of emesis or diarrhea.  She denies fever but has had chills and sweats.  She denies any urinary difficulty.  She denies any sick contacts.  There was no treatment at home.   Past Medical History:  Diagnosis Date  . Back pain   . Carotid stenosis 06/22/2015   1-39% bilateral carotid artery stenosis 06/2015.  . Diabetes mellitus   . Essential hypertension 06/15/2015  . GERD (gastroesophageal reflux disease)   . Hypertension     Patient Active Problem List   Diagnosis Date Noted  . Carotid stenosis 06/22/2015  . Essential hypertension 06/15/2015  . DIABETES MELLITUS, TYPE II 05/08/2007  . ALLERGIC RHINITIS 05/08/2007  . LOW BACK PAIN, CHRONIC 05/08/2007  . SHOULDER IMPINGEMENT SYNDROME 05/08/2007    Past Surgical History:  Procedure Laterality Date  . SHOULDER SURGERY       OB History   None      Home Medications    Prior to Admission medications   Medication Sig Start Date End Date Taking? Authorizing Provider  chlorpheniramine-HYDROcodone (TUSSIONEX PENNKINETIC ER) 10-8 MG/5ML SUER Take 5 mLs by mouth every 12 (twelve) hours as needed for cough. 08/07/18  Yes Lorre Nick, MD  cyanocobalamin (,VITAMIN B-12,) 1000 MCG/ML injection INJECT 1 ML IM EACH MONTH 03/08/17  Yes  [provider]  glimepiride (AMARYL) 4 MG tablet Take 4 mg by mouth 2 (two) times daily.   Yes [provider]  guaiFENesin (MUCINEX) 600 MG 12 hr tablet Take 600 mg by mouth 2 (two) times daily.   Yes [provider]  ibuprofen (ADVIL,MOTRIN) 800 MG tablet Take 800 mg by mouth every 8 (eight) hours as needed for mild pain.   Yes [provider]  Insulin Detemir (LEVEMIR FLEXPEN) 100 UNIT/ML Pen Inject 20 Units into the skin daily at 10 pm.   Yes [provider]  methocarbamol (ROBAXIN) 500 MG tablet Take 500 mg by mouth 2 (two) times daily as needed for muscle spasms.   Yes [provider]  pantoprazole (PROTONIX) 40 MG tablet Take 40 mg by mouth 2 (two) times daily.    Yes [provider]  predniSONE (STERAPRED UNI-PAK 21 TAB) 10 MG (21) TBPK tablet Take by mouth daily. Take 6 tabs by mouth daily  for 2 days, then 5 tabs for 2 days, then 4 tabs for 2 days, then 3 tabs for 2 days, 2 tabs for 2 days, then 1 tab by mouth daily for 2 days 08/07/18  Yes Lorre Nick, MD  simvastatin (ZOCOR) 10 MG tablet TK 1 T PO HS 03/21/17  Yes [provider]  sitaGLIPtin (JANUVIA) 100 MG tablet Take 100 mg by mouth daily.   Yes [provider]  Vitamin D, Ergocalciferol, (DRISDOL) 50000 UNITS  CAPS capsule Take 50,000 Units by mouth every 7 (seven) days. Every Monday   Yes [provider]  albuterol (PROAIR HFA) 108 (90 Base) MCG/ACT inhaler Inhale 2 puffs into the lungs every 4 (four) hours as needed for wheezing or shortness of breath. Patient not taking: Reported on 08/16/2018 02/02/18   Eustace [name], MD  B-D ULTRAFINE III SHORT PEN 31G X 8 MM MISC ADMINISTER INSULIN TID 01/26/17   [provider]  budesonide-formoterol (SYMBICORT) 160-4.5 MCG/ACT inhaler Inhale 2 puffs into the lungs 2 (two) times daily. Patient not taking: Reported on 08/16/2018 03/31/17   Alfonse Spruce, MD  fluticasone [facility]) 50  MCG/ACT nasal spray Place 1 spray into both nostrils daily. Patient not taking: Reported on 08/16/2018 03/31/17   Alfonse Spruce, MD  gabapentin (NEURONTIN) 100 MG capsule Take 1 capsule (100 mg total) by mouth 3 (three) times daily. Patient not taking: Reported on 08/16/2018 02/02/18   Eustace [name], MD  triamcinolone ointment (KENALOG) 0.1 % Apply 1 application topically 2 (two) times daily. Patient not taking: Reported on 08/16/2018 03/31/17   Alfonse Spruce, MD  enoxaparin (LOVENOX) 30 MG/0.3ML injection Inject 0.3 mLs (30 mg total) into the skin every 12 (twelve) hours. 03/12/12 03/12/12  Clemetine Marker, MD    Family History Family History  Problem Relation Age of Onset  . Diabetes Mother   . Hypertension Mother   . Diabetes Sister   . Hypertension Sister   . Hypertension Maternal Grandmother   . Diabetes Maternal Grandmother   . Hypertension Maternal Grandfather   . Diabetes Maternal Grandfather   . Hypertension Paternal Grandmother   . Diabetes Paternal Grandmother   . Hypertension Paternal Grandfather   . Diabetes Paternal Grandfather     Social History Social History   Tobacco Use  . Smoking status: Never Smoker  . Smokeless tobacco: Never Used  Substance Use Topics  . Alcohol use: No  . Drug use: No     Allergies   Metformin   Review of Systems Review of Systems  Gastrointestinal: Positive for abdominal pain.  All other systems reviewed and are negative.    Physical Exam Updated Vital Signs BP (!) 152/84 (BP Location: Left Arm)   Pulse (!) 105   Temp 97.6 F (36.4 C) (Oral)   Resp 20   Ht 5\' 5"  (1.651 m)   Wt 74.8 kg   LMP 02/16/2012   SpO2 100%   BMI 27.46 kg/m   Physical Exam  Nursing note and vitals reviewed.  53 year old female, resting comfortably and in no acute distress. Vital signs are significant for elevated heart rate and elevated systolic blood pressure. Oxygen saturation is 100%, which is normal. Head is normocephalic  and atraumatic. PERRLA, EOMI. Oropharynx is clear. Neck is nontender and supple without adenopathy or JVD. Back is nontender and there is no CVA tenderness. Lungs are clear without rales, wheezes, or rhonchi. Chest is nontender. Heart has regular rate and rhythm without murmur. Abdomen is soft, flat, with moderate tenderness diffusely.  There is no rebound or guarding.  There are no masses or hepatosplenomegaly and peristalsis is hypoactive. Extremities have no cyanosis or edema, full range of motion is present. Skin is warm and dry without rash. Neurologic: Mental status is normal, cranial nerves are intact, there are no motor or sensory deficits.  ED Treatments / Results  Labs (all labs ordered are listed, but only abnormal results are displayed) Labs Reviewed  COMPREHENSIVE METABOLIC PANEL -  Abnormal; Notable for the following components:      Result Value   Sodium 133 (*)    Chloride 95 (*)    Glucose, Bld 709 (*)    All other components within normal limits  CBC - Abnormal; Notable for the following components:   WBC 20.2 (*)    RBC 6.29 (*)    Hemoglobin 15.5 (*)    HCT 49.2 (*)    MCV 78.2 (*)    MCH 24.6 (*)    All other components within normal limits  URINALYSIS, ROUTINE W REFLEX MICROSCOPIC - Abnormal; Notable for the following components:   Color, Urine COLORLESS (*)    Glucose, UA >=500 (*)    All other components within normal limits  CBG MONITORING, ED - Abnormal; Notable for the following components:   Glucose-Capillary 525 (*)    All other components within normal limits  CBG MONITORING, ED - Abnormal; Notable for the following components:   Glucose-Capillary 239 (*)    All other components within normal limits  LIPASE, BLOOD   Radiology Ct Abdomen Pelvis W Contrast  Result Date: 08/16/2018 CLINICAL DATA:  Acute generalized abdominal pain. EXAM: CT ABDOMEN AND PELVIS WITH CONTRAST TECHNIQUE: Multidetector CT imaging of the abdomen and pelvis was performed  using the standard protocol following bolus administration of intravenous contrast. CONTRAST:  100 mL ISOVUE-300 IOPAMIDOL (ISOVUE-300) INJECTION 61% COMPARISON:  CT scan April 01, 2016. FINDINGS: Lower chest: No acute abnormality. Hepatobiliary: No focal liver abnormality is seen. No gallstones, gallbladder wall thickening, or biliary dilatation. Pancreas: Unremarkable. No pancreatic ductal dilatation or surrounding inflammatory changes. Spleen: Normal in size without focal abnormality. Adrenals/Urinary Tract: Adrenal glands are unremarkable. Kidneys are normal, without renal calculi, focal lesion, or hydronephrosis. Bladder is unremarkable. Stomach/Bowel: The stomach and appendix are unremarkable. There appears to be diffuse wall thickening throughout the colon; while this may simply represent lack of distension, it is significantly changed compared to prior exam in the possibility of infectious or inflammatory colitis cannot be excluded. Vascular/Lymphatic: No significant vascular findings are present. No enlarged abdominal or pelvic lymph nodes. Reproductive: Uterus and bilateral adnexa are unremarkable. Other: No abdominal wall hernia or abnormality. No abdominopelvic ascites. Musculoskeletal: No acute or significant osseous findings. IMPRESSION: There appears to be diffuse wall thickening throughout the colon which is more prominent compared to prior exam, although nose surrounding inflammatory changes are noted. This may simply represent lack of distension, but some degree of infectious or inflammatory colitis cannot be excluded. Clinical correlation is recommended. No other abnormality seen in the abdomen or pelvis. Electronically Signed   By: Lupita Raider, M.D.   On: 08/16/2018 07:51    Procedures Procedures  CRITICAL CARE Performed by: Dione Booze Total critical care time: 35 minutes Critical care time was exclusive of separately billable procedures and treating other patients. Critical care was  necessary to treat or prevent imminent or life-threatening deterioration. Critical care was time spent personally by me on the following activities: development of treatment plan with patient and/or surrogate as well as nursing, discussions with consultants, evaluation of patient's response to treatment, examination of patient, obtaining history from patient or surrogate, ordering and performing treatments and interventions, ordering and review of laboratory studies, ordering and review of radiographic studies, pulse oximetry and re-evaluation of patient's condition.  Medications Ordered in ED Medications  sodium chloride 0.9 % bolus 1,000 mL (0 mLs Intravenous Stopped 08/16/18 0755)  insulin aspart (novoLOG) injection 10 Units (10 Units Intravenous Given 08/16/18 0621)  ondansetron [facility](ZOFRAN) injection 4 mg (4 mg Intravenous Given 08/16/18 0555)  morphine 4 MG/ML injection 4 mg (4 mg Intravenous Given 08/16/18 0555)  iopamidol (ISOVUE-300) 61 % injection 100 mL (100 mLs Intravenous Contrast Given 08/16/18 0648)  sodium chloride (PF) 0.9 % injection (  Given by Other 08/16/18 0645)  ciprofloxacin (CIPRO) tablet 500 mg (500 mg Oral Given 08/16/18 0815)  metroNIDAZOLE (FLAGYL) tablet 500 mg (500 mg Oral Given 08/16/18 [id number])     Initial Impression / Assessment and Plan / ED Course  I have reviewed the triage vital signs and the nursing notes.  Pertinent labs & imaging results that were available during my care of the patient were reviewed by me and considered in my medical decision making (see chart for details).  Abdominal pain with vomiting and diarrhea.  This may be viral gastroenteritis, but labs show severe hyperglycemia with glucose 709.  Anion gap is normal.  Lipase is normal.  Renal function is normal.  WBC is markedly elevated at 20.2.  Urinalysis is significant for presence of glucose and mucus.  She is given IV fluids and IV insulin.  She is sent for CT of abdomen and pelvis.  Old records are  reviewed, and she has no relevant past visits.  Glucose is come down to an adequate level with above-noted treatment.  Labs show significant leukocytosis, but CT of abdomen and pelvis only showed questionable colitis involving the right colon.  Patient is reexamined, and abdomen is benign.  She is felt to be safe for discharge.  She is discharged with prescriptions for ciprofloxacin and metronidazole as well as ondansetron for nausea.  Given a small number of oxycodone-acetaminophen tablets for pain.  Return precautions discussed.  Recommended close follow-up with PCP.  Final Clinical Impressions(s) / ED Diagnoses   Final diagnoses:  Colitis  Non-intractable vomiting with nausea, unspecified vomiting type  Hyperglycemia    ED Discharge Orders         Ordered    ciprofloxacin (CIPRO) 500 MG tablet  2 times daily     08/16/18 0817    metroNIDAZOLE (FLAGYL) 500 MG tablet  3 times daily     08/16/18 0817    ondansetron (ZOFRAN) 4 MG tablet  Every 6 hours PRN     08/16/18 0817    oxyCODONE-acetaminophen (PERCOCET) 5-325 MG tablet  Every 4 hours PRN     08/16/18 0817           Dione BoozeGlick, Orene Abbasi, MD 08/16/18 [id number]

## 2018-08-16 NOTE — Discharge Instructions (Signed)
Take loperamide as needed for diarrhea.  Return if symptoms are not being controlled adequately at home.

## 2018-08-16 NOTE — ED Triage Notes (Signed)
Pt arriving for abdominal pain that began late last night. Endorses nausea but no vomiting.

## 2019-12-17 ENCOUNTER — Emergency Department (HOSPITAL_COMMUNITY): Payer: Medicaid Other

## 2019-12-17 ENCOUNTER — Other Ambulatory Visit: Payer: Self-pay

## 2019-12-17 ENCOUNTER — Encounter (HOSPITAL_COMMUNITY): Payer: Self-pay

## 2019-12-17 ENCOUNTER — Emergency Department (HOSPITAL_COMMUNITY)
Admission: EM | Admit: 2019-12-17 | Discharge: 2019-12-18 | Disposition: A | Discharge status: Home / Self Care | Payer: Medicaid Other | Attending: Emergency Medicine | Admitting: Emergency Medicine

## 2019-12-17 DIAGNOSIS — R079 Chest pain, unspecified: Secondary | ICD-10-CM | POA: Diagnosis present

## 2019-12-17 DIAGNOSIS — R1010 Upper abdominal pain, unspecified: Secondary | ICD-10-CM | POA: Diagnosis not present

## 2019-12-17 DIAGNOSIS — Z79899 Other long term (current) drug therapy: Secondary | ICD-10-CM | POA: Diagnosis not present

## 2019-12-17 DIAGNOSIS — Z794 Long term (current) use of insulin: Secondary | ICD-10-CM | POA: Diagnosis not present

## 2019-12-17 DIAGNOSIS — E119 Type 2 diabetes mellitus without complications: Secondary | ICD-10-CM | POA: Diagnosis not present

## 2019-12-17 DIAGNOSIS — R1013 Epigastric pain: Secondary | ICD-10-CM | POA: Diagnosis not present

## 2019-12-17 DIAGNOSIS — I10 Essential (primary) hypertension: Secondary | ICD-10-CM | POA: Diagnosis not present

## 2019-12-17 LAB — BASIC METABOLIC PANEL
Anion gap: 8 (ref 5–15)
BUN: 14 mg/dL (ref 6–20)
CO2: 26 mmol/L (ref 22–32)
Calcium: 9.5 mg/dL (ref 8.9–10.3)
Chloride: 103 mmol/L (ref 98–111)
Creatinine, Ser: 0.86 mg/dL (ref 0.44–1.00)
GFR calc Af Amer: >60 mL/min (ref >60)
GFR calc non Af Amer: >60 mL/min (ref >60)
Glucose, Bld: 277 mg/dL — ABNORMAL HIGH (ref 70–99)
Potassium: 4.5 mmol/L (ref 3.5–5.1)
Sodium: 137 mmol/L (ref 135–145)

## 2019-12-17 LAB — URINALYSIS, ROUTINE W REFLEX MICROSCOPIC
Bilirubin Urine: NEGATIVE
Glucose, UA: >=500 mg/dL — AB
Hgb urine dipstick: NEGATIVE
Ketones, ur: NEGATIVE mg/dL
Leukocytes,Ua: NEGATIVE
Nitrite: NEGATIVE
Protein, ur: NEGATIVE mg/dL
Specific Gravity, Urine: 1.011 (ref 1.005–1.030)
pH: 5 (ref 5.0–8.0)

## 2019-12-17 LAB — CBC
HCT: 41.4 % (ref 36.0–46.0)
Hemoglobin: 13.1 g/dL (ref 12.0–15.0)
MCH: 24.5 pg — ABNORMAL LOW (ref 26.0–34.0)
MCHC: 31.6 g/dL (ref 30.0–36.0)
MCV: 77.4 fL — ABNORMAL LOW (ref 80.0–100.0)
Platelets: 210 10*3/uL (ref 150–400)
RBC: 5.35 MIL/uL — ABNORMAL HIGH (ref 3.87–5.11)
RDW: 13.4 % (ref 11.5–15.5)
WBC: 13.9 10*3/uL — ABNORMAL HIGH (ref 4.0–10.5)
nRBC: 0 % (ref 0.0–0.2)

## 2019-12-17 LAB — PREGNANCY, URINE: Preg Test, Ur: NEGATIVE

## 2019-12-17 LAB — TROPONIN I (HIGH SENSITIVITY): Troponin I (High Sensitivity): <2 ng/L (ref <18)

## 2019-12-17 LAB — LIPASE, BLOOD: Lipase: 29 U/L (ref 11–51)

## 2019-12-17 MED ORDER — SODIUM CHLORIDE 0.9% FLUSH: 3.0000 mL | Freq: Once | INTRAVENOUS | Status: DC

## 2019-12-17 MED ORDER — SODIUM CHLORIDE 0.9 % IV BOLUS
1000.0000 mL | Freq: Once | INTRAVENOUS | Status: AC
  Administered 2019-12-17: 22:00:00 1000 mL via INTRAVENOUS

## 2019-12-17 MED ORDER — ONDANSETRON HCL 4 MG/2ML IJ SOLN
4.0000 mg | Freq: Once | INTRAMUSCULAR | Status: AC
  Administered 2019-12-17: 4 mg via INTRAVENOUS
  Filled 2019-12-17: qty 2

## 2019-12-17 MED ORDER — SUCRALFATE 1 G PO TABS: 1.0000 g | ORAL_TABLET | Freq: Three times a day (TID) | ORAL | 0 refills | Status: DC

## 2019-12-17 MED ORDER — SUCRALFATE 1 GM/10ML PO SUSP
1.0000 g | Freq: Once | ORAL | Status: AC
  Administered 2019-12-17: 1 g via ORAL
  Filled 2019-12-17: qty 10

## 2019-12-17 NOTE — ED Provider Notes (Signed)
[facility]-EMERGENCY DEPT Provider Note   CSN: [id number] Arrival date & time: 12/17/19  1940     History Chief Complaint  Patient presents with  . Chest Pain    Rachel Robertson is a 54 y.o. female.  She speaks some [place] but mostly Somali and her son is here who wishes to translate for her.  She has a history of GERD.  She is complaining of upper abdominal pain burning in nature that radiates into her chest and up into her throat.  This has been going on for 2 weeks and is associated with frequent vomiting.  She drinks a lot of milk to try to help with this.  She is on a PPI.  She was prescribed some Zofran with some improvement in her nausea, but it has not been helping much.  She said she had an endoscopy in the past.  There is no blood in the vomiting.  The history is provided by the patient and a relative. The history is limited by a language barrier. A language interpreter was used (family).  Chest Pain Pain location:  Substernal area and epigastric Pain quality: burning   Radiates to: throat. Pain severity:  Moderate Onset quality:  Gradual Timing:  Intermittent Progression:  Waxing and waning Chronicity:  New Relieved by:  Nothing Worsened by:  Nothing Ineffective treatments:  Antacids Associated symptoms: abdominal pain, heartburn, nausea and vomiting   Associated symptoms: no cough, no fever, no headache and no shortness of breath     HPI: A 54 year old patient with a history of treated diabetes, hypertension, hypercholesterolemia and obesity presents for evaluation of chest pain. Initial onset of pain was more than 6 hours ago. The patient's chest pain is not worse with exertion. The patient complains of nausea. The patient's chest pain is middle- or left-sided, is not well-localized, is not described as heaviness/pressure/tightness, is not sharp and does radiate to the arms/jaw/neck. The patient denies diaphoresis. The patient has no history of stroke,  has no history of peripheral artery disease, has not smoked in the past 90 days and has no relevant family history of coronary artery disease (first degree relative at less than age 78).   Past Medical History:  Diagnosis Date  . Back pain   . Carotid stenosis 06/22/2015   1-39% bilateral carotid artery stenosis 06/2015.  . Diabetes mellitus   . Essential hypertension 06/15/2015  . GERD (gastroesophageal reflux disease)   . Hypertension     Patient Active Problem List   Diagnosis Date Noted  . Carotid stenosis 06/22/2015  . Essential hypertension 06/15/2015  . DIABETES MELLITUS, TYPE II 05/08/2007  . ALLERGIC RHINITIS 05/08/2007  . LOW BACK PAIN, CHRONIC 05/08/2007  . SHOULDER IMPINGEMENT SYNDROME 05/08/2007    Past Surgical History:  Procedure Laterality Date  . SHOULDER SURGERY       OB History   No obstetric history on file.     Family History  Problem Relation Age of Onset  . Diabetes Mother   . Hypertension Mother   . Diabetes Sister   . Hypertension Sister   . Hypertension Maternal Grandmother   . Diabetes Maternal Grandmother   . Hypertension Maternal Grandfather   . Diabetes Maternal Grandfather   . Hypertension Paternal Grandmother   . Diabetes Paternal Grandmother   . Hypertension Paternal Grandfather   . Diabetes Paternal Grandfather     Social History   Tobacco Use  . Smoking status: Never Smoker  . Smokeless tobacco: Never  Used  Substance Use Topics  . Alcohol use: No  . Drug use: No    Home Medications Prior to Admission medications   Medication Sig Start Date End Date Taking? Authorizing Provider  cetirizine (ZYRTEC) 10 MG tablet Take 10 mg by mouth daily as needed for allergies.   Yes [provider]  cyanocobalamin (,VITAMIN B-12,) 1000 MCG/ML injection Inject 1,000 mcg into the skin every 30 (thirty) days.  03/08/17  Yes [provider]  diclofenac Sodium (VOLTAREN) 1 % GEL Apply 2 g topically 4 (four) times daily as  needed (pain).   Yes [provider]  diphenoxylate-atropine (LOMOTIL) 2.5-0.025 MG tablet Take 1 tablet by mouth every 6 (six) hours as needed for diarrhea or loose stools.   Yes [provider]  glimepiride (AMARYL) 4 MG tablet Take 4 mg by mouth 2 (two) times daily.   Yes [provider]  hydrOXYzine (ATARAX/VISTARIL) 25 MG tablet Take 25 mg by mouth 2 (two) times daily as needed for anxiety.  11/15/19  Yes [provider]  ibuprofen (ADVIL,MOTRIN) 800 MG tablet Take 800 mg by mouth every 8 (eight) hours as needed for mild pain.   Yes [provider]  Insulin Detemir (LEVEMIR FLEXPEN) 100 UNIT/ML Pen Inject 20 Units into the skin daily at 10 pm.   Yes [provider]  losartan (COZAAR) 50 MG tablet Take 50 mg by mouth daily. 09/18/19  Yes [provider]  ondansetron (ZOFRAN) 4 MG tablet Take 1 tablet (4 mg total) by mouth every 6 (six) hours as needed for nausea. 52/7/78  Yes Delora Fuel, MD  pantoprazole (PROTONIX) 40 MG tablet Take 40 mg by mouth 2 (two) times daily.    Yes [provider]  simvastatin (ZOCOR) 10 MG tablet Take 10 mg by mouth at bedtime.  03/21/17  Yes [provider]  sitaGLIPtin (JANUVIA) 100 MG tablet Take 100 mg by mouth daily.   Yes [provider]  B-D ULTRAFINE III SHORT PEN 31G X 8 MM MISC ADMINISTER INSULIN TID 01/26/17   [provider]  chlorpheniramine-HYDROcodone (TUSSIONEX PENNKINETIC ER) 10-8 MG/5ML SUER Take 5 mLs by mouth every 12 (twelve) hours as needed for cough. Patient not taking: Reported on 12/17/2019 08/07/18   Lacretia Leigh, MD  ciprofloxacin (CIPRO) 500 MG tablet Take 1 tablet (500 mg total) by mouth 2 (two) times daily. Patient not taking: Reported on 10/18/2351 61/4/43   Delora Fuel, MD  gabapentin (NEURONTIN) 100 MG capsule Take 1 capsule (100 mg total) by mouth 3 (three) times daily. Patient not taking: Reported on 08/16/2018 02/02/18   Raylene Everts, MD   metroNIDAZOLE (FLAGYL) 500 MG tablet Take 1 tablet (500 mg total) by mouth 3 (three) times daily. Patient not taking: Reported on 09/17/4006 67/6/19   Delora Fuel, MD  oxyCODONE-acetaminophen (PERCOCET) 5-325 MG tablet Take 1 tablet by mouth every 4 (four) hours as needed for moderate pain. Patient not taking: Reported on 5/0/9326 71/2/45   Delora Fuel, MD  predniSONE (STERAPRED UNI-PAK 21 TAB) 10 MG (21) TBPK tablet Take by mouth daily. Take 6 tabs by mouth daily  for 2 days, then 5 tabs for 2 days, then 4 tabs for 2 days, then 3 tabs for 2 days, 2 tabs for 2 days, then 1 tab by mouth daily for 2 days Patient not taking: Reported on 12/17/2019 08/07/18   Lacretia Leigh, MD  enoxaparin (LOVENOX) 30 MG/0.3ML injection Inject 0.3 mLs (30 mg total) into the skin every 12 (  twelve) hours. 03/12/12 03/12/12  Clemetine Marker, MD    Allergies    Metformin  Review of Systems   Review of Systems  Constitutional: Negative for fever.  HENT: Negative for sore throat.   Eyes: Negative for visual disturbance.  Respiratory: Negative for cough and shortness of breath.   Cardiovascular: Positive for chest pain.  Gastrointestinal: Positive for abdominal pain, heartburn, nausea and vomiting.  Genitourinary: Negative for dysuria.  Musculoskeletal: Negative for neck pain.  Skin: Negative for rash.  Neurological: Negative for headaches.    Physical Exam Updated Vital Signs BP (!) 141/61 (BP Location: Left Arm)   Pulse 73   Temp 97.8 F (36.6 C) (Oral)   Resp 18   Ht 5\' 5"  (1.651 m)   Wt 72.6 kg   LMP 02/16/2012   SpO2 100%   BMI 26.63 kg/m   Physical Exam Vitals and nursing note reviewed.  Constitutional:      General: She is not in acute distress.    Appearance: She is well-developed.  HENT:     Head: Normocephalic and atraumatic.  Eyes:     Conjunctiva/sclera: Conjunctivae normal.  Cardiovascular:     Rate and Rhythm: Normal rate and regular rhythm.     Heart sounds: Normal heart sounds. No  murmur.  Pulmonary:     Effort: Pulmonary effort is normal. No respiratory distress.     Breath sounds: Normal breath sounds.  Abdominal:     Palpations: Abdomen is soft.     Tenderness: There is abdominal tenderness (epigastric).  Musculoskeletal:        General: Normal range of motion.     Cervical back: Neck supple.     Right lower leg: No tenderness.     Left lower leg: No tenderness.  Skin:    General: Skin is warm and dry.     Capillary Refill: Capillary refill takes less than 2 seconds.  Neurological:     General: No focal deficit present.     Mental Status: She is alert.     ED Results / Procedures / Treatments   Labs (all labs ordered are listed, but only abnormal results are displayed) Labs Reviewed  BASIC METABOLIC PANEL - Abnormal; Notable for the following components:      Result Value   Glucose, Bld 277 (*)    All other components within normal limits  CBC - Abnormal; Notable for the following components:   WBC 13.9 (*)    RBC 5.35 (*)    MCV 77.4 (*)    MCH 24.5 (*)    All other components within normal limits  URINALYSIS, ROUTINE W REFLEX MICROSCOPIC - Abnormal; Notable for the following components:   Glucose, UA >=500 (*)    Bacteria, UA RARE (*)    All other components within normal limits  HEPATIC FUNCTION PANEL - Abnormal; Notable for the following components:   Total Protein 6.4 (*)    Albumin 3.0 (*)    All other components within normal limits  LIPASE, BLOOD  PREGNANCY, URINE  TROPONIN I (HIGH SENSITIVITY)  TROPONIN I (HIGH SENSITIVITY)    EKG EKG Interpretation  Date/Time:  Monday December 17 2019 19:54:50 EDT Ventricular Rate:  73 PR Interval:    QRS Duration: 88 QT Interval:  392 QTC Calculation: 432 R Axis:   63 Text Interpretation: Sinus rhythm Minimal ST elevation, anterior leads similar to prior 11/19 Confirmed by 12/19 [phone]) on 12/17/2019 9:26:07 PM   Radiology DG Chest 2 View  Result Date: 12/17/2019 CLINICAL DATA:   Chest pain radiating posteriorly EXAM: CHEST - 2 VIEW COMPARISON:  08/07/2018 FINDINGS: The heart size and mediastinal contours are within normal limits. Both lungs are clear. The visualized skeletal structures are unremarkable. IMPRESSION: No active cardiopulmonary disease. Electronically Signed   By: Elige Ko   On: 12/17/2019 20:21    Procedures Procedures (including critical care time)  Medications Ordered in ED Medications  sodium chloride flush (NS) 0.9 % injection 3 mL (3 mLs Intravenous Not Given 12/17/19 2112)  sucralfate (CARAFATE) 1 GM/10ML suspension 1 g (has no administration in time range)  sodium chloride 0.9 % bolus 1,000 mL (has no administration in time range)  ondansetron (ZOFRAN) injection 4 mg (has no administration in time range)    ED Course  I have reviewed the triage vital signs and the nursing notes.  Pertinent labs & imaging results that were available during my care of the patient were reviewed by me and considered in my medical decision making (see chart for details).  Clinical Course as of Dec 18 1014  Mon Dec 17, 2019  2317 Reevaluated patient and she says her symptoms are much improve, after Carafate and some IV fluids.  We will prescribe her Carafate and discussed how to turn it into a slurry so she can continue with that.   [MB]    Clinical Course User Index [MB] Terrilee Files, MD   MDM Rules/Calculators/A&P HEAR Score: 5                    This patient complains of chest and upper abdominal pain; this involves an extensive number of treatment Options and is a complaint that carries with it a high risk of complications and Morbidity. The differential includes ACS, pneumonia, pneumothorax, GERD, cholelithiasis, perforation  I ordered, reviewed and interpreted labs, which included CBC with elevated white count of 13.9 unclear significance.  Normal hemoglobin.  Chemistry showing elevated blood glucose of 277 known diabetic.  LFT showing no  evidence of obstruction troponin low including repeat troponin I ordered medication Carafate with improvement in her symptoms I ordered imaging studies which included chest x-ray and I independently    visualized and interpreted imaging which showed no infiltrate or pneumothorax Additional history obtained from patient's son Previous records obtained and reviewed prior ED and clinic visits on epic  After the interventions stated above, I reevaluated the patient and found patient symptoms improved and tolerating p.o.  We will prescribe her Carafate and she understands to follow-up with her doctor  Return instructions discussed with patient and her son.  Final Clinical Impression(s) / ED Diagnoses Final diagnoses:  Nonspecific chest pain  Upper abdominal pain    Rx / DC Orders ED Discharge Orders         Ordered    sucralfate (CARAFATE) 1 g tablet  3 times daily with meals & bedtime     12/17/19 2358           Terrilee Files, MD 12/18/19 1020

## 2019-12-17 NOTE — ED Triage Notes (Addendum)
Pt reports generalized chest pain and abdominal pain for the last 2 weeks. Hx of GERD. Reports this pain as a burning from her epigastric area up to her throat. Also endorses nausea. Son states that she drinks a lot of milk. Was seen for similar complaint about 2 weeks ago per son. Son at bedside.

## 2019-12-17 NOTE — Discharge Instructions (Addendum)
You were seen in the emergency department for burning pain in your upper abdomen and chest into your throat.  This is likely acid reflux.  Your symptoms improved with some medication that helps coat the esophagus and stomach.  I am prescribing this medication in pill form which she can add to some water and make it into a slurry and drink it.  Please continue your other regular medications.  Call your doctor for follow-up and you may end up needing another endoscopy.  Return to the emergency department if any worsening or concerning symptoms

## 2019-12-18 LAB — HEPATIC FUNCTION PANEL
ALT: 29 U/L (ref 0–44)
AST: 24 U/L (ref 15–41)
Albumin: 3 g/dL — ABNORMAL LOW (ref 3.5–5.0)
Alkaline Phosphatase: 79 U/L (ref 38–126)
Bilirubin, Direct: <0.1 mg/dL (ref 0.0–0.2)
Total Bilirubin: 0.4 mg/dL (ref 0.3–1.2)
Total Protein: 6.4 g/dL — ABNORMAL LOW (ref 6.5–8.1)

## 2019-12-18 LAB — TROPONIN I (HIGH SENSITIVITY): Troponin I (High Sensitivity): 2 ng/L (ref <18)

## 2020-03-13 DIAGNOSIS — Z419 Encounter for procedure for purposes other than remedying health state, unspecified: Secondary | ICD-10-CM | POA: Diagnosis not present

## 2020-04-11 DIAGNOSIS — D511 Vitamin B12 deficiency anemia due to selective vitamin B12 malabsorption with proteinuria: Secondary | ICD-10-CM | POA: Diagnosis not present

## 2020-04-13 ENCOUNTER — Ambulatory Visit (HOSPITAL_COMMUNITY)
Admission: EM | Admit: 2020-04-13 | Discharge: 2020-04-13 | Disposition: A | Discharge status: Home / Self Care | Payer: Medicaid Other

## 2020-04-13 DIAGNOSIS — Z419 Encounter for procedure for purposes other than remedying health state, unspecified: Secondary | ICD-10-CM | POA: Diagnosis not present

## 2020-04-13 DIAGNOSIS — M545 Low back pain, unspecified: Secondary | ICD-10-CM

## 2020-04-13 DIAGNOSIS — E1165 Type 2 diabetes mellitus with hyperglycemia: Secondary | ICD-10-CM

## 2020-04-13 DIAGNOSIS — M5412 Radiculopathy, cervical region: Secondary | ICD-10-CM

## 2020-04-13 DIAGNOSIS — M79601 Pain in right arm: Secondary | ICD-10-CM

## 2020-04-13 MED ORDER — DICLOFENAC SODIUM 75 MG PO TBEC: 75.0000 mg | DELAYED_RELEASE_TABLET | Freq: Two times a day (BID) | ORAL | 0 refills | Status: DC

## 2020-04-13 MED ORDER — TIZANIDINE HCL 4 MG PO TABS: 4.0000 mg | ORAL_TABLET | Freq: Three times a day (TID) | ORAL | 0 refills | Status: DC | PRN

## 2020-04-13 NOTE — ED Triage Notes (Signed)
Pt c/o right arm pain from mid-humerus area to hand onset yesterday. Pt states it hurts to open a jar, turn door handle etc. Denies known trauma to area. Reports right shoulder surgery several years ago. Also c/o chronic ongoing pain to lumbar and left thoracic area. Last took tylenol yesterday for pain

## 2020-04-13 NOTE — ED Provider Notes (Signed)
[facility]   MRN: [id number] DOB: 1965/12/02  Subjective:   Rachel Robertson is a 54 y.o. female presenting for 1 day history of cute onset right-sided neck pain that radiates into her trapezius, shoulder and down her arm.  Patient also has acute on chronic low back pain.  Denies falls, trauma, weakness.  She has had right shoulder surgery several years ago.  Tried Tylenol.  She is severely uncontrolled diabetic, blood sugars greater than 200s.  No current facility-administered medications for this encounter.  Current Outpatient Medications:  .  B-D ULTRAFINE III SHORT PEN 31G X 8 MM MISC, ADMINISTER INSULIN TID, Disp: , Rfl: 11 .  cetirizine (ZYRTEC) 10 MG tablet, Take 10 mg by mouth daily as needed for allergies., Disp: , Rfl:  .  chlorpheniramine-HYDROcodone (TUSSIONEX PENNKINETIC ER) 10-8 MG/5ML SUER, Take 5 mLs by mouth every 12 (twelve) hours as needed for cough. (Patient not taking: Reported on 12/17/2019), Disp: 140 mL, Rfl: 0 .  ciprofloxacin (CIPRO) 500 MG tablet, Take 1 tablet (500 mg total) by mouth 2 (two) times daily. (Patient not taking: Reported on 12/17/2019), Disp: 20 tablet, Rfl: 0 .  cyanocobalamin (,VITAMIN B-12,) 1000 MCG/ML injection, Inject 1,000 mcg into the skin every 30 (thirty) days. , Disp: , Rfl: 5 .  diclofenac Sodium (VOLTAREN) 1 % GEL, Apply 2 g topically 4 (four) times daily as needed (pain)., Disp: , Rfl:  .  diphenoxylate-atropine (LOMOTIL) 2.5-0.025 MG tablet, Take 1 tablet by mouth every 6 (six) hours as needed for diarrhea or loose stools., Disp: , Rfl:  .  gabapentin (NEURONTIN) 100 MG capsule, Take 1 capsule (100 mg total) by mouth 3 (three) times daily. (Patient not taking: Reported on 08/16/2018), Disp: 90 capsule, Rfl: 0 .  glimepiride (AMARYL) 4 MG tablet, Take 4 mg by mouth 2 (two) times daily., Disp: , Rfl:  .  hydrOXYzine (ATARAX/VISTARIL) 25 MG tablet, Take 25 mg by mouth 2 (two) times daily as needed for anxiety. , Disp: , Rfl:  .  ibuprofen  (ADVIL,MOTRIN) 800 MG tablet, Take 800 mg by mouth every 8 (eight) hours as needed for mild pain., Disp: , Rfl:  .  Insulin Detemir (LEVEMIR FLEXPEN) 100 UNIT/ML Pen, Inject 20 Units into the skin daily at 10 pm., Disp: , Rfl:  .  losartan (COZAAR) 50 MG tablet, Take 50 mg by mouth daily., Disp: , Rfl:  .  metoCLOPramide (REGLAN) 5 MG tablet, Take 5 mg by mouth 3 (three) times daily., Disp: , Rfl:  .  metroNIDAZOLE (FLAGYL) 500 MG tablet, Take 1 tablet (500 mg total) by mouth 3 (three) times daily. (Patient not taking: Reported on 12/17/2019), Disp: 30 tablet, Rfl: 0 .  ondansetron (ZOFRAN) 4 MG tablet, Take 1 tablet (4 mg total) by mouth every 6 (six) hours as needed for nausea., Disp: 20 tablet, Rfl: 0 .  oxyCODONE-acetaminophen (PERCOCET) 5-325 MG tablet, Take 1 tablet by mouth every 4 (four) hours as needed for moderate pain. (Patient not taking: Reported on 12/17/2019), Disp: 6 tablet, Rfl: 0 .  pantoprazole (PROTONIX) 40 MG tablet, Take 40 mg by mouth 2 (two) times daily. , Disp: , Rfl:  .  predniSONE (STERAPRED UNI-PAK 21 TAB) 10 MG (21) TBPK tablet, Take by mouth daily. Take 6 tabs by mouth daily  for 2 days, then 5 tabs for 2 days, then 4 tabs for 2 days, then 3 tabs for 2 days, 2 tabs for 2 days, then 1 tab by mouth daily for 2 days (  Patient not taking: Reported on 12/17/2019), Disp: 42 tablet, Rfl: 0 .  simvastatin (ZOCOR) 10 MG tablet, Take 10 mg by mouth at bedtime. , Disp: , Rfl: 0 .  sitaGLIPtin (JANUVIA) 100 MG tablet, Take 100 mg by mouth daily., Disp: , Rfl:  .  sucralfate (CARAFATE) 1 g tablet, Take 1 tablet (1 g total) by mouth 4 (four) times daily -  with meals and at bedtime., Disp: 60 tablet, Rfl: 0 .  TRULICITY 1.5 MG/0.5ML SOPN, Inject 1.5 mg into the skin once a week., Disp: , Rfl:    Allergies  Allergen Reactions  . Metformin     REACTION: GI intolerance    Past Medical History:  Diagnosis Date  . Back pain   . Carotid stenosis 06/22/2015   1-39% bilateral carotid artery  stenosis 06/2015.  . Diabetes mellitus   . Essential hypertension 06/15/2015  . GERD (gastroesophageal reflux disease)   . Hypertension      Past Surgical History:  Procedure Laterality Date  . SHOULDER SURGERY      Family History  Problem Relation Age of Onset  . Diabetes Mother   . Hypertension Mother   . Diabetes Sister   . Hypertension Sister   . Hypertension Maternal Grandmother   . Diabetes Maternal Grandmother   . Hypertension Maternal Grandfather   . Diabetes Maternal Grandfather   . Hypertension Paternal Grandmother   . Diabetes Paternal Grandmother   . Hypertension Paternal Grandfather   . Diabetes Paternal Grandfather     Social History   Tobacco Use  . Smoking status: Never Smoker  . Smokeless tobacco: Never Used  Substance Use Topics  . Alcohol use: No  . Drug use: No    ROS   Objective:   Vitals: BP (!) 148/76 (BP Location: Left Arm)   Pulse 83   Temp 98.2 F (36.8 C) (Oral)   Resp 16   LMP 02/16/2012   SpO2 100%   Physical Exam Constitutional:      General: She is not in acute distress.    Appearance: Normal appearance. She is well-developed. She is not ill-appearing.  HENT:     Head: Normocephalic and atraumatic.     Right Ear: External ear normal.     Left Ear: External ear normal.     Nose: Nose normal.     Mouth/Throat:     Mouth: Mucous membranes are moist.     Pharynx: Oropharynx is clear.  Eyes:     General: No scleral icterus.       Right eye: No discharge.        Left eye: No discharge.     Extraocular Movements: Extraocular movements intact.     Conjunctiva/sclera: Conjunctivae normal.     Pupils: Pupils are equal, round, and reactive to light.  Cardiovascular:     Rate and Rhythm: Normal rate.  Pulmonary:     Effort: Pulmonary effort is normal.  Musculoskeletal:     Cervical back: Tenderness (along area outlined) present. Pain with movement present.       Back:  Skin:    General: Skin is warm and dry.    Neurological:     General: No focal deficit present.     Mental Status: She is alert and oriented to person, place, and time.     Cranial Nerves: No cranial nerve deficit.     Motor: No weakness.     Coordination: Coordination normal.     Gait: Gait normal.  Deep Tendon Reflexes: Reflexes normal.  Psychiatric:        Mood and Affect: Mood normal.        Behavior: Behavior normal.        Thought Content: Thought content normal.        Judgment: Judgment normal.     Assessment and Plan :   PDMP not reviewed this encounter.  1. Cervical radiculopathy   2. Acute low back pain without sciatica, unspecified back pain laterality   3. Right arm pain   4. Uncontrolled type 2 diabetes mellitus with hyperglycemia (HCC)     Patient has severely uncontrolled diabetes. Cannot use prednisone. Will use diclofenac, muscle relaxant. Follow up with neurospine specialist.  No recent traumas, will defer imaging as such.  Patient ambulating on her own without assistance.  Counseled patient on potential for adverse effects with medications prescribed/recommended today, ER and return-to-clinic precautions discussed, patient verbalized understanding.    Wallis Bamberg, [place] 04/14/20 1713

## 2020-04-14 ENCOUNTER — Encounter (HOSPITAL_COMMUNITY): Payer: Self-pay | Admitting: Urgent Care

## 2020-04-29 ENCOUNTER — Emergency Department (HOSPITAL_COMMUNITY): Payer: Medicaid Other

## 2020-04-29 ENCOUNTER — Emergency Department (HOSPITAL_COMMUNITY)
Admission: EM | Admit: 2020-04-29 | Discharge: 2020-04-29 | Disposition: A | Discharge status: Home / Self Care | Payer: Medicaid Other | Attending: Emergency Medicine | Admitting: Emergency Medicine

## 2020-04-29 ENCOUNTER — Other Ambulatory Visit: Payer: Self-pay

## 2020-04-29 ENCOUNTER — Encounter (HOSPITAL_COMMUNITY): Payer: Self-pay | Admitting: Emergency Medicine

## 2020-04-29 DIAGNOSIS — Z79899 Other long term (current) drug therapy: Secondary | ICD-10-CM | POA: Diagnosis not present

## 2020-04-29 DIAGNOSIS — M545 Low back pain: Secondary | ICD-10-CM | POA: Insufficient documentation

## 2020-04-29 DIAGNOSIS — M79641 Pain in right hand: Secondary | ICD-10-CM | POA: Diagnosis present

## 2020-04-29 DIAGNOSIS — M50323 Other cervical disc degeneration at C6-C7 level: Secondary | ICD-10-CM | POA: Diagnosis not present

## 2020-04-29 DIAGNOSIS — M12811 Other specific arthropathies, not elsewhere classified, right shoulder: Secondary | ICD-10-CM | POA: Diagnosis not present

## 2020-04-29 DIAGNOSIS — I10 Essential (primary) hypertension: Secondary | ICD-10-CM | POA: Diagnosis not present

## 2020-04-29 DIAGNOSIS — M19011 Primary osteoarthritis, right shoulder: Secondary | ICD-10-CM | POA: Diagnosis not present

## 2020-04-29 DIAGNOSIS — S43001A Unspecified subluxation of right shoulder joint, initial encounter: Secondary | ICD-10-CM | POA: Diagnosis not present

## 2020-04-29 DIAGNOSIS — R079 Chest pain, unspecified: Secondary | ICD-10-CM | POA: Insufficient documentation

## 2020-04-29 DIAGNOSIS — S6991XA Unspecified injury of right wrist, hand and finger(s), initial encounter: Secondary | ICD-10-CM | POA: Diagnosis not present

## 2020-04-29 DIAGNOSIS — Z794 Long term (current) use of insulin: Secondary | ICD-10-CM | POA: Insufficient documentation

## 2020-04-29 DIAGNOSIS — M4802 Spinal stenosis, cervical region: Secondary | ICD-10-CM | POA: Diagnosis not present

## 2020-04-29 DIAGNOSIS — M1810 Unilateral primary osteoarthritis of first carpometacarpal joint, unspecified hand: Secondary | ICD-10-CM

## 2020-04-29 DIAGNOSIS — E119 Type 2 diabetes mellitus without complications: Secondary | ICD-10-CM | POA: Diagnosis not present

## 2020-04-29 DIAGNOSIS — M5021 Other cervical disc displacement,  high cervical region: Secondary | ICD-10-CM | POA: Diagnosis not present

## 2020-04-29 DIAGNOSIS — M50222 Other cervical disc displacement at C5-C6 level: Secondary | ICD-10-CM | POA: Diagnosis not present

## 2020-04-29 DIAGNOSIS — M75101 Unspecified rotator cuff tear or rupture of right shoulder, not specified as traumatic: Secondary | ICD-10-CM | POA: Diagnosis not present

## 2020-04-29 LAB — CBC WITH DIFFERENTIAL/PLATELET
Abs Immature Granulocytes: 0.04 10*3/uL (ref 0.00–0.07)
Basophils Absolute: 0.1 10*3/uL (ref 0.0–0.1)
Basophils Relative: 1 %
Eosinophils Absolute: 0.2 10*3/uL (ref 0.0–0.5)
Eosinophils Relative: 1 %
HCT: 40.5 % (ref 36.0–46.0)
Hemoglobin: 13 g/dL (ref 12.0–15.0)
Immature Granulocytes: 0 %
Lymphocytes Relative: 26 %
Lymphs Abs: 3.5 10*3/uL (ref 0.7–4.0)
MCH: 24.5 pg — ABNORMAL LOW (ref 26.0–34.0)
MCHC: 32.1 g/dL (ref 30.0–36.0)
MCV: 76.4 fL — ABNORMAL LOW (ref 80.0–100.0)
Monocytes Absolute: 0.6 10*3/uL (ref 0.1–1.0)
Monocytes Relative: 4 %
Neutro Abs: 9 10*3/uL — ABNORMAL HIGH (ref 1.7–7.7)
Neutrophils Relative %: 68 %
Platelets: 166 10*3/uL (ref 150–400)
RBC: 5.3 MIL/uL — ABNORMAL HIGH (ref 3.87–5.11)
RDW: 14.2 % (ref 11.5–15.5)
WBC: 13.4 10*3/uL — ABNORMAL HIGH (ref 4.0–10.5)
nRBC: 0 % (ref 0.0–0.2)

## 2020-04-29 LAB — BASIC METABOLIC PANEL
Anion gap: 13 (ref 5–15)
BUN: 15 mg/dL (ref 6–20)
CO2: 22 mmol/L (ref 22–32)
Calcium: 8.6 mg/dL — ABNORMAL LOW (ref 8.9–10.3)
Chloride: 102 mmol/L (ref 98–111)
Creatinine, Ser: 0.76 mg/dL (ref 0.44–1.00)
GFR calc Af Amer: >60 mL/min (ref >60)
GFR calc non Af Amer: >60 mL/min (ref >60)
Glucose, Bld: 307 mg/dL — ABNORMAL HIGH (ref 70–99)
Potassium: 4.1 mmol/L (ref 3.5–5.1)
Sodium: 137 mmol/L (ref 135–145)

## 2020-04-29 MED ORDER — NAPROXEN 500 MG PO TABS: 500.0000 mg | ORAL_TABLET | Freq: Two times a day (BID) | ORAL | 0 refills | Status: AC

## 2020-04-29 MED ORDER — NAPROXEN 500 MG PO TABS: 500.0000 mg | ORAL_TABLET | Freq: Two times a day (BID) | ORAL | 0 refills | Status: DC

## 2020-04-29 MED ORDER — KETOROLAC TROMETHAMINE 30 MG/ML IJ SOLN
30.0000 mg | Freq: Once | INTRAMUSCULAR | Status: AC
  Administered 2020-04-29: 30 mg via INTRAMUSCULAR
  Filled 2020-04-29: qty 1

## 2020-04-29 NOTE — ED Provider Notes (Signed)
Medical screening examination/treatment/procedure(s) were conducted as a shared visit with non-physician practitioner(s) and myself.  I personally evaluated the patient during the encounter. Briefly, the patient is a 54 y.o. female with history of diabetes, hypertension who presents the ED with right hand weakness, shoulder pain, neck pain.  Has had intermittent pain on the right arm for the last several weeks.  Has had weakness in her right hand as well.  No other neurological symptoms.  Sensations intact.  No facial droop or vision changes.  It appears that she is having weakness may be secondary to pain however could be cervical radiculopathy.  Given that she is having weakness we will get a MRI of the C-spine to rule out any surgical process.  However could be an intrinsic hand issue or as well as a peripheral nerve issue as she has had some shoulder surgery in the past.  Neurovascularly she is intact.  She does appear to have weakness in hand grip.  Will check basic labs.  I believe she has also had surgery to the right hand will also get an x-ray there.  Please see my PAs note for further results, evaluation, disposition of the patient.  No concern for stroke.  This chart was dictated using voice recognition software.  Despite best efforts to proofread,  errors can occur which can change the documentation meaning.     EKG Interpretation None           Virgina Norfolk, DO 04/29/20 1712

## 2020-04-29 NOTE — ED Provider Notes (Signed)
[facility]-EMERGENCY DEPT Provider Note   CSN: [id number] Arrival date & time: 04/29/20  1600     History Chief Complaint  Patient presents with  . Hand Pain    3 weeks  . Back Pain    "long time"   Somali interpretor was used throughout this evaluation  Rachel Robertson is a 54 y.o. female.  HPI    55 year old female with a history of back pain, carotid stenosis, diabetes, hypertension, GERD, who presents the emergency department today for evaluation of neck pain/right upper extremity pain.  States that she has had pain to the neck/right shoulder that radiates down the right upper extremity.  This has been chronic in nature for a long time however the past 3 weeks the pain has gotten significantly worse.  States she is having difficulty lifting up her arm due to the pain.  She also has significant pain in her hand and is unable to close her hand fully.  She has had some weakness to the right upper extremity, it is unclear if this is secondary to pain.  She denies any numbness.  She also complains of pain to the bilateral ribs which has been ongoing for the same time period.  Is worse with movements and palpation.  She denies any shortness of breath, hemoptysis, bilateral lower extremity swelling/calf pain/history of PE/DVT, history of cancer, recent hospital admission or recent surgeries.  Denies loss of control of bowels or bladder. No urinary retention or fevers.   Patient states she was seen at urgent care for her symptoms several weeks ago and was given medication which helped temporarily but the pain returned.  She denies any recent falls or trauma.  Past Medical History:  Diagnosis Date  . Back pain   . Carotid stenosis 06/22/2015   1-39% bilateral carotid artery stenosis 06/2015.  . Diabetes mellitus   . Essential hypertension 06/15/2015  . GERD (gastroesophageal reflux disease)   . Hypertension     Patient Active Problem List   Diagnosis Date Noted  .  Carotid stenosis 06/22/2015  . Essential hypertension 06/15/2015  . DIABETES MELLITUS, TYPE II 05/08/2007  . ALLERGIC RHINITIS 05/08/2007  . LOW BACK PAIN, CHRONIC 05/08/2007  . SHOULDER IMPINGEMENT SYNDROME 05/08/2007    Past Surgical History:  Procedure Laterality Date  . SHOULDER SURGERY       OB History   No obstetric history on file.     Family History  Problem Relation Age of Onset  . Diabetes Mother   . Hypertension Mother   . Diabetes Sister   . Hypertension Sister   . Hypertension Maternal Grandmother   . Diabetes Maternal Grandmother   . Hypertension Maternal Grandfather   . Diabetes Maternal Grandfather   . Hypertension Paternal Grandmother   . Diabetes Paternal Grandmother   . Hypertension Paternal Grandfather   . Diabetes Paternal Grandfather     Social History   Tobacco Use  . Smoking status: Never Smoker  . Smokeless tobacco: Never Used  Substance Use Topics  . Alcohol use: No  . Drug use: No    Home Medications Prior to Admission medications   Medication Sig Start Date End Date Taking? Authorizing Provider  B-D ULTRAFINE III SHORT PEN 31G X 8 MM MISC ADMINISTER INSULIN TID 01/26/17   [provider]  cetirizine (ZYRTEC) 10 MG tablet Take 10 mg by mouth daily as needed for allergies.    [provider]  chlorpheniramine-HYDROcodone Stevphen Meuse PENNKINETIC ER) 10-8 MG/5ML Kenton Kingfisher  Take 5 mLs by mouth every 12 (twelve) hours as needed for cough. Patient not taking: Reported on 12/17/2019 08/07/18   Lorre Nick, MD  ciprofloxacin (CIPRO) 500 MG tablet Take 1 tablet (500 mg total) by mouth 2 (two) times daily. Patient not taking: Reported on 12/17/2019 08/16/18   Dione Booze, MD  cyanocobalamin (,VITAMIN B-12,) 1000 MCG/ML injection Inject 1,000 mcg into the skin every 30 (thirty) days.  03/08/17   [provider]  diclofenac (VOLTAREN) 75 MG EC tablet Take 1 tablet (75 mg total) by mouth 2 (two) times daily. 04/13/20   Wallis Bamberg,  PA-C  diclofenac Sodium (VOLTAREN) 1 % GEL Apply 2 g topically 4 (four) times daily as needed (pain).    [provider]  diphenoxylate-atropine (LOMOTIL) 2.5-0.025 MG tablet Take 1 tablet by mouth every 6 (six) hours as needed for diarrhea or loose stools.    [provider]  gabapentin (NEURONTIN) 100 MG capsule Take 1 capsule (100 mg total) by mouth 3 (three) times daily. Patient not taking: Reported on 08/16/2018 02/02/18   Eustace [name], MD  glimepiride (AMARYL) 4 MG tablet Take 4 mg by mouth 2 (two) times daily.    [provider]  hydrOXYzine (ATARAX/VISTARIL) 25 MG tablet Take 25 mg by mouth 2 (two) times daily as needed for anxiety.  11/15/19   [provider]  ibuprofen (ADVIL,MOTRIN) 800 MG tablet Take 800 mg by mouth every 8 (eight) hours as needed for mild pain.    [provider]  Insulin Detemir (LEVEMIR FLEXPEN) 100 UNIT/ML Pen Inject 20 Units into the skin daily at 10 pm.    [provider]  losartan (COZAAR) 50 MG tablet Take 50 mg by mouth daily. 09/18/19   [provider]  metoCLOPramide (REGLAN) 5 MG tablet Take 5 mg by mouth 3 (three) times daily. 03/24/20   [provider]  metroNIDAZOLE (FLAGYL) 500 MG tablet Take 1 tablet (500 mg total) by mouth 3 (three) times daily. Patient not taking: Reported on 12/17/2019 08/16/18   Dione Booze, MD  naproxen (NAPROSYN) 500 MG tablet Take 1 tablet (500 mg total) by mouth 2 (two) times daily for 7 days. 04/29/20 05/06/20  Hector Venne S, PA-C  ondansetron (ZOFRAN) 4 MG tablet Take 1 tablet (4 mg total) by mouth every 6 (six) hours as needed for nausea. 08/16/18   Dione Booze, MD  oxyCODONE-acetaminophen (PERCOCET) 5-325 MG tablet Take 1 tablet by mouth every 4 (four) hours as needed for moderate pain. Patient not taking: Reported on 12/17/2019 08/16/18   Dione Booze, MD  pantoprazole (PROTONIX) 40 MG tablet Take 40 mg by mouth 2 (two) times daily.     [provider]  predniSONE (STERAPRED UNI-PAK 21 TAB) 10 MG (21) TBPK tablet Take by mouth daily. Take 6 tabs by mouth daily  for 2 days, then 5 tabs for 2 days, then 4 tabs for 2 days, then 3 tabs for 2 days, 2 tabs for 2 days, then 1 tab by mouth daily for 2 days Patient not taking: Reported on 12/17/2019 08/07/18   Lorre Nick, MD  simvastatin (ZOCOR) 10 MG tablet Take 10 mg by mouth at bedtime.  03/21/17   [provider]  sitaGLIPtin (JANUVIA) 100 MG tablet Take 100 mg by mouth daily.    [provider]  sucralfate (CARAFATE) 1 g tablet Take 1 tablet (1 g total) by mouth 4 (four) times daily -  with meals and at bedtime. 12/17/19  Terrilee Files, MD  tiZANidine (ZANAFLEX) 4 MG tablet Take 1 tablet (4 mg total) by mouth every 8 (eight) hours as needed. 04/13/20   Wallis Bamberg, PA-C  TRULICITY 1.5 MG/0.5ML SOPN Inject 1.5 mg into the skin once a week. 03/27/20   [provider]  enoxaparin (LOVENOX) 30 MG/0.3ML injection Inject 0.3 mLs (30 mg total) into the skin every 12 (twelve) hours. 03/12/12 03/12/12  Clemetine Marker, MD    Allergies    Metformin  Review of Systems   Review of Systems  Constitutional: Negative for chills and fever.  HENT: Negative for ear pain and sore throat.   Eyes: Negative for visual disturbance.  Respiratory: Negative for cough and shortness of breath.   Cardiovascular: Positive for chest pain (chest wall pain bilat). Negative for leg swelling.  Gastrointestinal: Negative for abdominal pain, constipation, diarrhea, nausea and vomiting.  Genitourinary: Negative for dysuria and hematuria.  Musculoskeletal: Positive for neck pain.  Skin: Negative for rash.  Neurological: Negative for headaches.  All other systems reviewed and are negative.   Physical Exam Updated Vital Signs BP (!) 149/75 (BP Location: Left Arm)   Pulse 74   Temp 98.1 F (36.7 C) (Oral)   Resp 18   LMP 02/16/2012   SpO2 100%   Physical Exam Vitals and nursing note reviewed.    Constitutional:      General: She is not in acute distress.    Appearance: She is well-developed.  HENT:     Head: Normocephalic and atraumatic.  Eyes:     Conjunctiva/sclera: Conjunctivae normal.  Cardiovascular:     Rate and Rhythm: Normal rate and regular rhythm.     Heart sounds: No murmur heard.   Pulmonary:     Effort: Pulmonary effort is normal. No respiratory distress.     Breath sounds: Normal breath sounds.  Chest:     Chest wall: Tenderness (bilat ribs and anterior chest wall which reproduces pain) present.  Abdominal:     Palpations: Abdomen is soft.     Tenderness: There is no abdominal tenderness.  Musculoskeletal:     Cervical back: Neck supple.     Comments: TTP to the cervical spine and right cervical paraspinous muscles. TTP throughout the right shoulder and right upper arm.   Skin:    General: Skin is warm and dry.  Neurological:     Mental Status: She is alert.     Comments: Sensation intact to BUE and BLE. Slightly decreased strength to the RUE mainly with grip strength (unclear if 2/2 pain as pt has significant discomfort with this, also has h/o surgery to this hand). 5/5 strength to the BLE.      ED Results / Procedures / Treatments   Labs (all labs ordered are listed, but only abnormal results are displayed) Labs Reviewed  CBC WITH DIFFERENTIAL/PLATELET - Abnormal; Notable for the following components:      Result Value   WBC 13.4 (*)    RBC 5.30 (*)    MCV 76.4 (*)    MCH 24.5 (*)    Neutro Abs 9.0 (*)    All other components within normal limits  BASIC METABOLIC PANEL - Abnormal; Notable for the following components:   Glucose, Bld 307 (*)    Calcium 8.6 (*)    All other components within normal limits    EKG None  Radiology DG Shoulder Right  Result Date: 04/29/2020 CLINICAL DATA:  Pain.  Right arm and hand weakness for 3 weeks.  EXAM: RIGHT SHOULDER - 2+ VIEW COMPARISON:  Shoulder radiograph 03/08/2013 FINDINGS: No acute fracture. No  dislocation. There is superior subluxation of the humeral head abutting the undersurface of the acromion. Mild subcortical cystic change involving the lateral humeral head. Glenohumeral joint is unremarkable. Mild acromioclavicular degenerative change. No soft tissue calcification or focal soft tissue abnormality. No erosion or bony destruction. IMPRESSION: 1. Superior subluxation of the humeral head abutting the undersurface of the acromion consistent with rotator cuff arthropathy. 2. Mild acromioclavicular degenerative change. Electronically Signed   By: Narda Rutherford M.D.   On: 04/29/2020 19:22   MR Cervical Spine Wo Contrast  Result Date: 04/29/2020 CLINICAL DATA:  Cervical radiculopathy, no red flags. EXAM: MRI CERVICAL SPINE WITHOUT CONTRAST TECHNIQUE: Multiplanar, multisequence MR imaging of the cervical spine was performed. No intravenous contrast was administered. COMPARISON:  CT of the cervical spine 02/05/2014 FINDINGS: Alignment: Straightening of the expected cervical lordosis. No significant spondylolisthesis. Vertebrae: Vertebral body height is maintained. No marrow edema or focal suspicious osseous lesion Cord: No spinal cord signal abnormality is identified Posterior Fossa, vertebral arteries, paraspinal tissues: No abnormality identified within included portions of the posterior fossa. Flow voids preserved within the imaged cervical vertebral arteries. Paraspinal soft tissues within normal limits Disc levels: Mild multilevel disc degeneration. C2-C3: No significant disc herniation or stenosis. C3-C4: Tiny central disc protrusion. No significant canal or foraminal stenosis. C4-C5: No significant disc herniation or stenosis. C5-C6: No significant disc herniation or spinal canal stenosis. Uncinate hypertrophy contributes to mild relative right neural foraminal narrowing C6-C7: No significant disc herniation or spinal canal stenosis. Uncinate hypertrophy contributes to mild relative right neural  foraminal narrowing. C7-T1: Mild facet hypertrophy. No significant disc herniation, spinal canal stenosis or neural foraminal narrowing. T1-T2: This level is imaged sagittally. There is a shallow disc bulge without significant spinal canal stenosis or neural foraminal narrowing. IMPRESSION: Mild cervical spondylosis as outlined. There is no significant spinal canal stenosis at any level. Uncinate hypertrophy results in mild right neural foraminal narrowing at C5-C6 and C6-C7. Electronically Signed   By: Jackey Loge DO   On: 04/29/2020 18:17   DG Hand Complete Right  Result Date: 04/29/2020 CLINICAL DATA:  Hand pain.  Right arm and hand weakness for 3 weeks. EXAM: RIGHT HAND - COMPLETE 3+ VIEW COMPARISON:  None. FINDINGS: There is no evidence of fracture or dislocation. Mild osteoarthritis at the thumb carpal metacarpal and metacarpal phalangeal joint. Minimal osteoarthritis of the digits. No erosion or periosteal reaction. 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 There are vascular calcifications. No soft tissue calcifications. IMPRESSION: 1. 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 2. Vascular calcifications. Electronically Signed   By: Narda Rutherford M.D.   On: 04/29/2020 19:21    Procedures Procedures (including critical care time)  Medications Ordered in ED Medications  ketorolac (TORADOL) 30 MG/ML injection 30 mg (30 mg Intramuscular Given 04/29/20 1718)    ED Course  I have reviewed the triage vital signs and the nursing notes.  Pertinent labs & imaging results that were available during my care of the patient were reviewed by me and considered in my medical decision making (see chart for details).    MDM Rules/Calculators/A&P                          54 y/o F presenting with neck and RUE pain. Has new weakness to the right upper extremity, specifically the hand.  Difficult to determine whether  this weakness is due to pain or nerve impingement  based on exam.  Will obtain imaging to further assess this.  Reviewed/interpreted labs CBC with mild leukocytosis which appears chronic BMP with hyperglycemia but normal bicarb and no elevated anion gap to suggest DKA  Xray right shoulder: 1. Superior subluxation of the humeral head abutting the undersurface of the acromion consistent with rotator cuff arthropathy. 2. Mild acromioclavicular degenerative change. Xray right hand 1. Mild multifocal osteoarthritis most prominently involving the Thumb. 2. Vascular calcifications.  MRI cervical spine Mild cervical spondylosis as outlined. There is no significant spinal canal stenosis at any level. Uncinate hypertrophy results in mild right neural foraminal narrowing at C5-C6 and C6-C7.  Patient was given Toradol in the ED and on reassessment she states she has had some improvement from this medication.  We discussed the results of her imaging studies and plan for follow-up with orthopedics.  I will also send her home with anti-inflammatories.  She was given referral to orthopedics.  Advised on specific return precautions.  She voiced understanding plan reasons return.  All questions answered.  Patient able for discharge.  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

## 2020-04-29 NOTE — ED Triage Notes (Signed)
Pt c/o bilat back/flank pains "for a long time". Also c/o right hand and arm pains for 3 weeks. Denies falls or injuries. Denies urinary problems.

## 2020-04-29 NOTE — Discharge Instructions (Signed)
You may alternate taking Tylenol and Naproxen as needed for pain control. You may take Naproxen twice daily as directed on your discharge paperwork and you may take  [phone] mg of Tylenol every 6 hours. Do not exceed 4000 mg of Tylenol daily as this can lead to liver damage. Also, make sure to take Naproxen with meals as it can cause an upset stomach. Do not take other NSAIDs while taking Naproxen such as (Aleve, Ibuprofen, Aspirin, Celebrex, etc) and do not take more than the prescribed dose as this can lead to ulcers and bleeding in your GI tract. You may use warm and cold compresses to help with your symptoms.   Please follow up with your primary doctor or with the orthopedic doctor within the next 7-10 days for re-evaluation and further treatment of your symptoms.   Please return to the ER sooner if you have any new or worsening symptoms.   -------------------   Oda Cogan qaadashada Tylenol iyo Naproxen marka loo baahdo xakamaynta xanuunka. Waxaad qaadan kartaa Naproxen laba jeer Cherlynn Perches sida lagu faray warqadaha ka bixiddaada waxaadna qaadan kartaa [phone] mg oo Tylenol 6-dii saacba mar. Ha dhaafin 4000 mg oo ah Tylenol maalin kasta maxaa yeelay [place] waxay u horseedi kartaa dhaawaca beerka. [employer] kale, iska hubi inaad Naproxen cunto ku qaadato maadaama ay keeni karto [employer] Ha qaadan NSAID -yada kale inta aad qaadanayso Naproxen sida (Aleve, Ibuprofen, Aspirin, Celebrex, iwm) hana qaadan wax ka badan qiyaasta loo qoondeeyay maadaama ay tani u horseedi karto boogo iyo dhiig -bax ku dhaca mareenkaaga GI -ga. Waxaad isticmaali kartaa cadaadis diirran iyo qabow si ay kaaga caawiyaan calaamadahaaga.  Fadlan la soco takhtarkaaga koowaad ama takhtarka lafaha 7-10 maalmood ee soo socda si dib-u-qiimeyn iyo daaweyn dheeraad ah loogu yeesho calaamadahaaga.  Fadlan si dhaqso ah ugu soo noqo ER haddii aad leedahay astaamo cusub ama ka sii daraya.

## 2020-04-29 NOTE — ED Triage Notes (Signed)
Pt states that she was seen 3 weeks ago per her paperwork didn't follow up for back pains with neurosurgery. And states medications given havent helped.

## 2020-05-07 ENCOUNTER — Ambulatory Visit (INDEPENDENT_AMBULATORY_CARE_PROVIDER_SITE_OTHER): Payer: Medicaid Other | Admitting: Physician Assistant

## 2020-05-07 ENCOUNTER — Encounter: Payer: Self-pay | Admitting: Physician Assistant

## 2020-05-07 DIAGNOSIS — M792 Neuralgia and neuritis, unspecified: Secondary | ICD-10-CM

## 2020-05-07 NOTE — Progress Notes (Addendum)
Office Visit Note   Patient: Rachel Robertson           Date of Birth: [birth year]           MRN: [id number] Visit Date: 05/07/2020              Requested by: Fleet Contras, MD [address] [place],  [place] [address] PCP: Fleet Contras, MD   Assessment & Plan: Visit Diagnoses: No diagnosis found.  Plan: We will obtain EMG nerve conduction studies to rule out carpal tunnel syndrome for her right arm radicular pain.  Have him follow-up with Dr. Magnus Ivan after the ENT nerve conduction studies to go over the results and discuss further treatment.  Questions were encouraged and answered at length.  Discussed the plan of action with her son who was present today.  Follow-Up Instructions: Return After EMG/NCS.   Orders:  No orders of the defined types were placed in this encounter.  No orders of the defined types were placed in this encounter.     Procedures: No procedures performed   Clinical Data: No additional findings.   Subjective: Chief Complaint  Patient presents with  . Right Shoulder - Pain    HPI Rachel Robertson is a 16- Female I am seeing for the first time referred by the [facility], ER for radicular pain down her right arm patient states that she has sharp stabbing pain down the goes into her hand.  Shoulder surgeries but is unsure that her pain is worse over the last 3 weeks.  Has had no known. Pain.  Personally reviewed right shoulder films which showed superior subluxation.  This appears to slightly progressed from prior films which showed rotator cuff arthropathy in the past.  No acute findings. Right hand 3 views shows no acute fractures.  Mild arthritis at the [facility] joint of the thumb. MRI cervical spine showed mild cervical spondylosis.Uncinate hypertrophy at C5-C6 and C6 he has resulting in mild right neural foraminal stenosis.  Review of Systems See HPI otherwise negative  Objective: Vital Signs: LMP 02/16/2012   Physical Exam Constitutional:       Appearance: She is not ill-appearing or diaphoretic.  Pulmonary:     Effort: Pulmonary effort is normal.  Neurological:     Mental Status: She is alert and oriented to person, place, and time.  Psychiatric:        Mood and Affect: Mood normal.     Ortho Exam Upper extremity strength she has 5 out of 5 strengths biceps and triceps bilaterally.  Good range of motion of her elbow bilaterally.  She has subjective decreased sensation throughout both right hand.  Positive Tinel's of the median nerve at the right wrist positive compression of the median nerve at wrist.  Negative Phalen's.  Motor full motor of the left hand.  She has difficulty right hand thumb extension.  She is able to slowly bring her full extension but with much effort.  She has pain with abduction and abduction of the fingers. Specialty Comments:  No specialty comments available.  Imaging: No results found.   PMFS History: Patient Active Problem List   Diagnosis Date Noted  . Carotid stenosis 06/22/2015  . Essential hypertension 06/15/2015  . DIABETES MELLITUS, TYPE II 05/08/2007  . ALLERGIC RHINITIS 05/08/2007  . LOW BACK PAIN, CHRONIC 05/08/2007  . SHOULDER IMPINGEMENT SYNDROME 05/08/2007   Past Medical History:  Diagnosis Date  . Back pain   . Carotid stenosis 06/22/2015   1-39% bilateral  carotid artery stenosis 06/2015.  . Diabetes mellitus   . Essential hypertension 06/15/2015  . GERD (gastroesophageal reflux disease)   . Hypertension     Family History  Problem Relation Age of Onset  . Diabetes Mother   . Hypertension Mother   . Diabetes Sister   . Hypertension Sister   . Hypertension Maternal Grandmother   . Diabetes Maternal Grandmother   . Hypertension Maternal Grandfather   . Diabetes Maternal Grandfather   . Hypertension Paternal Grandmother   . Diabetes Paternal Grandmother   . Hypertension Paternal Grandfather   . Diabetes Paternal Grandfather     Past Surgical History:  Procedure  Laterality Date  . SHOULDER SURGERY     Social History   Occupational History  . Not on file  Tobacco Use  . Smoking status: Never Smoker  . Smokeless tobacco: Never Used  Substance and Sexual Activity  . Alcohol use: No  . Drug use: No  . Sexual activity: Never

## 2020-05-08 ENCOUNTER — Other Ambulatory Visit: Payer: Self-pay | Admitting: Radiology

## 2020-05-08 DIAGNOSIS — M792 Neuralgia and neuritis, unspecified: Secondary | ICD-10-CM

## 2020-05-14 DIAGNOSIS — Z419 Encounter for procedure for purposes other than remedying health state, unspecified: Secondary | ICD-10-CM | POA: Diagnosis not present

## 2020-05-23 DIAGNOSIS — I10 Essential (primary) hypertension: Secondary | ICD-10-CM | POA: Diagnosis not present

## 2020-05-23 DIAGNOSIS — E1140 Type 2 diabetes mellitus with diabetic neuropathy, unspecified: Secondary | ICD-10-CM | POA: Diagnosis not present

## 2020-05-23 DIAGNOSIS — E538 Deficiency of other specified B group vitamins: Secondary | ICD-10-CM | POA: Diagnosis not present

## 2020-06-02 DIAGNOSIS — E1140 Type 2 diabetes mellitus with diabetic neuropathy, unspecified: Secondary | ICD-10-CM | POA: Diagnosis not present

## 2020-06-02 DIAGNOSIS — E538 Deficiency of other specified B group vitamins: Secondary | ICD-10-CM | POA: Diagnosis not present

## 2020-06-02 DIAGNOSIS — D511 Vitamin B12 deficiency anemia due to selective vitamin B12 malabsorption with proteinuria: Secondary | ICD-10-CM | POA: Diagnosis not present

## 2020-06-02 DIAGNOSIS — I10 Essential (primary) hypertension: Secondary | ICD-10-CM | POA: Diagnosis not present

## 2020-06-02 DIAGNOSIS — Z0000 Encounter for general adult medical examination without abnormal findings: Secondary | ICD-10-CM | POA: Diagnosis not present

## 2020-06-02 DIAGNOSIS — Z79899 Other long term (current) drug therapy: Secondary | ICD-10-CM | POA: Diagnosis not present

## 2020-06-13 DIAGNOSIS — Z419 Encounter for procedure for purposes other than remedying health state, unspecified: Secondary | ICD-10-CM | POA: Diagnosis not present

## 2020-07-04 DIAGNOSIS — I10 Essential (primary) hypertension: Secondary | ICD-10-CM | POA: Diagnosis not present

## 2020-07-04 DIAGNOSIS — N3080 Other cystitis without hematuria: Secondary | ICD-10-CM | POA: Diagnosis not present

## 2020-07-04 DIAGNOSIS — E1140 Type 2 diabetes mellitus with diabetic neuropathy, unspecified: Secondary | ICD-10-CM | POA: Diagnosis not present

## 2020-07-04 DIAGNOSIS — D511 Vitamin B12 deficiency anemia due to selective vitamin B12 malabsorption with proteinuria: Secondary | ICD-10-CM | POA: Diagnosis not present

## 2020-07-04 DIAGNOSIS — E1040 Type 1 diabetes mellitus with diabetic neuropathy, unspecified: Secondary | ICD-10-CM | POA: Diagnosis not present

## 2020-07-04 DIAGNOSIS — S43421D Sprain of right rotator cuff capsule, subsequent encounter: Secondary | ICD-10-CM | POA: Diagnosis not present

## 2020-07-04 DIAGNOSIS — Z7189 Other specified counseling: Secondary | ICD-10-CM | POA: Diagnosis not present

## 2020-07-04 DIAGNOSIS — N760 Acute vaginitis: Secondary | ICD-10-CM | POA: Diagnosis not present

## 2020-07-14 DIAGNOSIS — Z419 Encounter for procedure for purposes other than remedying health state, unspecified: Secondary | ICD-10-CM | POA: Diagnosis not present

## 2020-08-13 DIAGNOSIS — Z419 Encounter for procedure for purposes other than remedying health state, unspecified: Secondary | ICD-10-CM | POA: Diagnosis not present

## 2020-08-15 ENCOUNTER — Encounter: Payer: Self-pay | Admitting: Physical Medicine and Rehabilitation

## 2020-08-15 ENCOUNTER — Encounter: Payer: Medicaid Other | Admitting: Physical Medicine and Rehabilitation

## 2020-08-15 ENCOUNTER — Ambulatory Visit (INDEPENDENT_AMBULATORY_CARE_PROVIDER_SITE_OTHER): Payer: Medicaid Other | Admitting: Physical Medicine and Rehabilitation

## 2020-08-15 ENCOUNTER — Other Ambulatory Visit: Payer: Self-pay

## 2020-08-15 DIAGNOSIS — M25541 Pain in joints of right hand: Secondary | ICD-10-CM

## 2020-08-15 DIAGNOSIS — M25511 Pain in right shoulder: Secondary | ICD-10-CM

## 2020-08-15 DIAGNOSIS — R202 Paresthesia of skin: Secondary | ICD-10-CM

## 2020-08-15 NOTE — Progress Notes (Signed)
Pt state right shoulder down her right arm. Pt state she feels pain that goes down her back. Pt state when she picking things up she feels pain. Pt state she feel numbness in her hands. Pt state she is right handed.  Numeric Pain Rating Scale and Functional Assessment Average Pain 8   In the last MONTH (on 0-10 scale) has pain interfered with the following?  1. General activity like being  able to carry out your everyday physical activities such as walking, climbing stairs, carrying groceries, or moving a chair?  Rating(10)

## 2020-08-18 NOTE — Progress Notes (Signed)
Rachel Robertson - 54 y.o. female MRN [id number]  Date of birth: 09/29/1965  Office Visit Note: Visit Date: 08/15/2020 PCP: Fleet Contras, MD Referred by: Fleet Contras, MD  Subjective: Chief Complaint  Patient presents with  . Right Shoulder - Pain  . Right Hand - Pain   HPI:  LACE Robertson is a 54 y.o. female who comes in today at the request of Rexene Edison, PA-C for electrodiagnostic study of the Right upper extremities.  Patient is Right hand dominant.  Her son acts as interpreter today.  She reports 8 out of 10 pain with numbness and tingling in a somewhat nondermatomal fashion into her right hand.  Upon further questioning she does get more numbness and tingling in the fourth and fifth digits.  Some tingling on the left.  She reports pain in the shoulder as well and it seems like it goes from the hand up to the shoulder.  No frank radicular symptoms down the arm.  She also gets some back pain and she relates the 2 together that she does get back pain but that is been a chronic issue.  She reports difficulty picking up objects and gets a lot of pain with hand movement.  She has had no prior electrodiagnostic study.  She does have type 2 diabetes.  I do not see a hemoglobin A1c measurement.  It does appear that over the last several years her glucose on chemistry panels is always consistently around 300 or higher.   ROS Otherwise per HPI.  Assessment & Plan: Visit Diagnoses:    ICD-10-CM   1. 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 There is also a mild right median nerve entrapment at the wrist affecting sensory components that may be asymptomatic.   There is no significant electrodiagnostic evidence of any other focal nerve entrapment, brachial plexopathy or cervical  radiculopathy.    Recommendations: 1.  Follow-up with referring physician. 2.  Continue current management of symptoms.  Meds & Orders: No orders of the defined types were placed in this encounter.   Orders Placed This Encounter  Procedures  . NCV with EMG (electromyography)    Follow-up: Return for Rexene Edison, PA-C.   Procedures: No procedures performed  EMG & NCV Findings: Evaluation of the right ulnar motor nerve showed decreased conduction velocity (A Elbow-B Elbow, 40 m/s).  The right median (across palm) sensory nerve showed no response (Palm) and prolonged distal peak latency (5.0 ms).  All remaining nerves (as indicated in the following tables) were within normal limits.    All examined muscles (as indicated in the following table) showed no evidence of electrical instability.    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 There is also a mild right median nerve entrapment at the wrist affecting sensory components that may be asymptomatic.   There is no significant electrodiagnostic evidence of any other focal nerve entrapment, brachial plexopathy or cervical radiculopathy.    Recommendations: 1.  Follow-up with referring physician. 2.  Continue current management of symptoms.  ___________________________ Elease Hashimoto Board Certified, American Board of Physical Medicine and Rehabilitation    Nerve Conduction Studies Anti Sensory Summary Table   Stim Site NR  Peak (ms) Norm Peak (ms) P-T Amp (V) Norm P-T Amp Site1 Site2 Delta-P (ms) Dist (cm) Vel (m/s) Norm Vel (m/s)  Right Median Acr Palm Anti Sensory (2nd Digit)  32.9C  Wrist    *5.0 <3.6 21.1 >10 Wrist Palm  0.0    Palm *NR  <2.0          Right Radial Anti Sensory (Base 1st Digit)  34.2C  Wrist    2.2 <3.1 19.9  Wrist Base 1st Digit 2.2 0.0    Right Ulnar Anti  Sensory (5th Digit)  33.5C  Wrist    3.0 <3.7 21.1 >15.0 Wrist 5th Digit 3.0 14.0 47 >38   Motor Summary Table   Stim Site NR Onset (ms) Norm Onset (ms) O-P Amp (mV) Norm O-P Amp Site1 Site2 Delta-0 (ms) Dist (cm) Vel (m/s) Norm Vel (m/s)  Right Median Motor (Abd Poll Brev)  34.2C  Wrist    3.9 <4.2 6.8 >5 Elbow Wrist 4.4 22.0 50 >50  Elbow    8.3  5.3         Right Ulnar Motor (Abd Dig Min)  33.2C  Wrist    2.7 <4.2 10.2 >3 B Elbow Wrist 3.5 18.5 53 >53  B Elbow    6.2  9.3  A Elbow B Elbow 2.0 8.0 *40 >53  A Elbow    8.2  8.7          EMG   Side Muscle Nerve Root Ins Act Fibs Psw Amp Dur Poly Recrt Int Dennie Bible Comment  Right 1stDorInt Ulnar C8-T1 Nml Nml Nml Nml Nml 0 Nml Nml   Right Abd Poll Brev Median C8-T1 Nml Nml Nml Nml Nml 0 Nml Nml   Right ExtDigCom   Nml Nml Nml Nml Nml 0 Nml Nml   Right Triceps Radial C6-7-8 Nml Nml Nml Nml Nml 0 Nml Nml   Right Deltoid Axillary C5-6 Nml Nml Nml Nml Nml 0 Nml Nml     Nerve Conduction Studies Anti Sensory Left/Right Comparison   Stim Site L Lat (ms) R Lat (ms) L-R Lat (ms) L Amp (V) R Amp (V) L-R Amp (%) Site1 Site2 L Vel (m/s) R Vel (m/s) L-R Vel (m/s)  Median Acr Palm Anti Sensory (2nd Digit)  32.9C  Wrist  *5.0   21.1  Wrist Palm     Palm             Radial Anti Sensory (Base 1st Digit)  34.2C  Wrist  2.2   19.9  Wrist Base 1st Digit     Ulnar Anti Sensory (5th Digit)  33.5C  Wrist  3.0   21.1  Wrist 5th Digit  47    Motor Left/Right Comparison   Stim Site L Lat (ms) R Lat (ms) L-R Lat (ms) L Amp (mV) R Amp (mV) L-R Amp (%) Site1 Site2 L Vel (m/s) R Vel (m/s) L-R Vel (m/s)  Median Motor (Abd Poll Brev)  34.2C  Wrist  3.9   6.8  Elbow Wrist  50   Elbow  8.3   5.3        Ulnar Motor (Abd Dig Min)  33.2C  Wrist  2.7   10.2  B Elbow Wrist  53   B Elbow  6.2   9.3  A Elbow B Elbow  *40   A Elbow  8.2   8.7           Waveforms:  Clinical History: Cervical radiculopathy, no red flags.  EXAM: MRI  CERVICAL SPINE WITHOUT CONTRAST  TECHNIQUE: Multiplanar, multisequence MR imaging of the cervical spine was performed. No intravenous contrast was administered.  COMPARISON:  CT of the cervical spine 02/05/2014  FINDINGS: Alignment: Straightening of the expected cervical lordosis. No significant spondylolisthesis.  Vertebrae: Vertebral body height is maintained. No marrow edema or focal suspicious osseous lesion  Cord: No spinal cord signal abnormality is identified  Posterior Fossa, vertebral arteries, paraspinal tissues: No abnormality identified within included portions of the posterior fossa. Flow voids preserved within the imaged cervical vertebral arteries. Paraspinal soft tissues within normal limits  Disc levels:  Mild multilevel disc degeneration.  C2-C3: No significant disc herniation or stenosis.  C3-C4: Tiny central disc protrusion. No significant canal or foraminal stenosis.  C4-C5: No significant disc herniation or stenosis.  C5-C6: No significant disc herniation or spinal canal stenosis. Uncinate hypertrophy contributes to mild relative right neural foraminal narrowing  C6-C7: No significant disc herniation or spinal canal stenosis. Uncinate hypertrophy contributes to mild relative right neural foraminal narrowing.  C7-T1: Mild facet hypertrophy. No significant disc herniation, spinal canal stenosis or neural foraminal narrowing.  T1-T2: This level is imaged sagittally. There is a shallow disc bulge without significant spinal canal stenosis or neural foraminal narrowing.  IMPRESSION: Mild cervical spondylosis as outlined. There is no significant spinal canal stenosis at any level. Uncinate hypertrophy results in mild right neural foraminal narrowing at C5-C6 and C6-C7.   Electronically Signed   By: Jackey Loge DO   On: 04/29/2020 18:17     Objective:  VS:  HT:    WT:   BMI:     BP:   HR: bpm  TEMP: ( )  RESP:   Physical Exam Musculoskeletal:        General: No swelling, tenderness or deformity.     Comments: Inspection reveals no atrophy of the bilateral APB or FDI or hand intrinsics. There is no swelling, color changes, allodynia or dystrophic changes. There is 5 out of 5 strength in the bilateral wrist extension, finger abduction and long finger flexion. There is decreased sensation in ulnar nerve distribution on the right. There is a negative Hoffmann's test bilaterally.  Skin:    General: Skin is warm and dry.     Findings: No erythema or rash.  Neurological:     General: No focal deficit present.     Mental Status: She is alert and oriented to person, place, and time.     Motor: No weakness or abnormal muscle tone.     Coordination: Coordination normal.  Psychiatric:        Mood and Affect: Mood normal.        Behavior: Behavior normal.      Imaging: No results found.

## 2020-08-18 NOTE — Procedures (Signed)
EMG & NCV Findings: Evaluation of the right ulnar motor nerve showed decreased conduction velocity (A Elbow-B Elbow, 40 m/s).  The right median (across palm) sensory nerve showed no response (Palm) and prolonged distal peak latency (5.0 ms).  All remaining nerves (as indicated in the following tables) were within normal limits.    All examined muscles (as indicated in the following table) showed no evidence of electrical instability.    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 There is also a mild right median nerve entrapment at the wrist affecting sensory components that may be asymptomatic.   There is no significant electrodiagnostic evidence of any other focal nerve entrapment, brachial plexopathy or cervical radiculopathy.    Recommendations: 1.  Follow-up with referring physician. 2.  Continue current management of symptoms.  ___________________________ Rachel Robertson FAAPMR Board Certified, American Board of Physical Medicine and Rehabilitation    Nerve Conduction Studies Anti Sensory Summary Table   Stim Site NR Peak (ms) Norm Peak (ms) P-T Amp (V) Norm P-T Amp Site1 Site2 Delta-P (ms) Dist (cm) Vel (m/s) Norm Vel (m/s)  Right Median Acr Palm Anti Sensory (2nd Digit)  32.9C  Wrist    *5.0 <3.6 21.1 >10 Wrist Palm  0.0    Palm *NR  <2.0          Right Radial Anti Sensory (Base 1st Digit)  34.2C  Wrist    2.2 <3.1 19.9  Wrist Base 1st Digit 2.2 0.0    Right Ulnar Anti Sensory (5th Digit)  33.5C  Wrist    3.0 <3.7 21.1 >15.0 Wrist 5th Digit 3.0 14.0 47 >38   Motor Summary Table   Stim Site NR Onset (ms) Norm Onset (ms) O-P Amp (mV) Norm O-P Amp Site1 Site2 Delta-0 (ms) Dist (cm) Vel (m/s) Norm Vel (m/s)  Right Median Motor (Abd Poll Brev)  34.2C  Wrist    3.9 <4.2 6.8 >5 Elbow Wrist 4.4 22.0 50 >50  Elbow    8.3  5.3          Right Ulnar Motor (Abd Dig Min)  33.2C  Wrist    2.7 <4.2 10.2 >3 B Elbow Wrist 3.5 18.5 53 >53  B Elbow    6.2  9.3  A Elbow B Elbow 2.0 8.0 *40 >53  A Elbow    8.2  8.7          EMG   Side Muscle Nerve Root Ins Act Fibs Psw Amp Dur Poly Recrt Int Dennie Bible Comment  Right 1stDorInt Ulnar C8-T1 Nml Nml Nml Nml Nml 0 Nml Nml   Right Abd Poll Brev Median C8-T1 Nml Nml Nml Nml Nml 0 Nml Nml   Right ExtDigCom   Nml Nml Nml Nml Nml 0 Nml Nml   Right Triceps Radial C6-7-8 Nml Nml Nml Nml Nml 0 Nml Nml   Right Deltoid Axillary C5-6 Nml Nml Nml Nml Nml 0 Nml Nml     Nerve Conduction Studies Anti Sensory Left/Right Comparison   Stim Site L Lat (ms) R Lat (ms) L-R Lat (ms) L Amp (V) R Amp (V) L-R Amp (%) Site1 Site2 L Vel (m/s) R Vel (m/s) L-R Vel (m/s)  Median Acr Palm Anti Sensory (2nd Digit)  32.9C  Wrist  *5.0   21.1  Wrist [employer]  Radial Anti Sensory (Base 1st Digit)  34.2C  Wrist  2.2   19.9  Wrist Base 1st Digit     Ulnar Anti Sensory (5th Digit)  33.5C  Wrist  3.0   21.1  Wrist 5th Digit  47    Motor Left/Right Comparison   Stim Site L Lat (ms) R Lat (ms) L-R Lat (ms) L Amp (mV) R Amp (mV) L-R Amp (%) Site1 Site2 L Vel (m/s) R Vel (m/s) L-R Vel (m/s)  Median Motor (Abd Poll Brev)  34.2C  Wrist  3.9   6.8  Elbow Wrist  50   Elbow  8.3   5.3        Ulnar Motor (Abd Dig Min)  33.2C  Wrist  2.7   10.2  B Elbow Wrist  53   B Elbow  6.2   9.3  A Elbow B Elbow  *40   A Elbow  8.2   8.7           Waveforms:

## 2020-08-22 DIAGNOSIS — I10 Essential (primary) hypertension: Secondary | ICD-10-CM | POA: Diagnosis not present

## 2020-08-22 DIAGNOSIS — E538 Deficiency of other specified B group vitamins: Secondary | ICD-10-CM | POA: Diagnosis not present

## 2020-08-22 DIAGNOSIS — K219 Gastro-esophageal reflux disease without esophagitis: Secondary | ICD-10-CM | POA: Diagnosis not present

## 2020-08-22 DIAGNOSIS — E1140 Type 2 diabetes mellitus with diabetic neuropathy, unspecified: Secondary | ICD-10-CM | POA: Diagnosis not present

## 2020-08-25 ENCOUNTER — Ambulatory Visit (INDEPENDENT_AMBULATORY_CARE_PROVIDER_SITE_OTHER): Payer: Medicaid Other | Admitting: Physician Assistant

## 2020-08-25 ENCOUNTER — Encounter: Payer: Self-pay | Admitting: Physical Medicine and Rehabilitation

## 2020-08-25 ENCOUNTER — Other Ambulatory Visit: Payer: Self-pay

## 2020-08-25 ENCOUNTER — Encounter: Payer: Self-pay | Admitting: Physician Assistant

## 2020-08-25 VITALS — Ht 65.0 in | Wt 160.0 lb

## 2020-08-25 DIAGNOSIS — G5601 Carpal tunnel syndrome, right upper limb: Secondary | ICD-10-CM

## 2020-08-25 DIAGNOSIS — M65311 Trigger thumb, right thumb: Secondary | ICD-10-CM

## 2020-08-25 DIAGNOSIS — G5621 Lesion of ulnar nerve, right upper limb: Secondary | ICD-10-CM | POA: Diagnosis not present

## 2020-08-25 NOTE — Progress Notes (Signed)
HPI Rachel Robertson comes in today for follow-up status post EMG nerve conduction studies right upper extremity.  We last saw the patient in August.  She underwent EMG nerve conduction studies by Dr. Alvester Morin on 08/15/2020 returns today for follow-up.  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  She states that she is having pain throughout the hand but mostly involving the ring and fifth finger on the right hand also she has developed triggering of her right thumb and that gets stuck down and she has to pull it up at times.  She is diabetic she is unsure of her last hemoglobin A1c.  She states she cannot hold anything in her right hand or open a bottle due to the pain in the hand.  Review of systems: Please see HPI otherwise negative  Physical exam: General well-developed well-nourished female no acute distress mood and affect appropriate. Right hand she has decreased sensation throughout the right hand compared to the left hand except for the index finger of the right hand which she states normal sensation.  Triggering of the right thumb is active with tenderness over the A1 pulley.  Negative Tinel's at the wrist.  Positive Tinel's over the right ulnar nerve.  Impression: Right cubital tunnel syndrome moderate Right thumb trigger finger Right mild carpal tunnel syndrome   Plan: I discussed with the patient recommend decompression of the right ulnar nerve at the elbow for cubital tunnel syndrome.  Also discussed with her treatments for the trigger thumb these include the use of diclofenac gel over the area, cortisone injection and trigger finger release.  Currently I would monitor her for carpal tunnel syndrome symptoms.  She would like to discuss all of this with her family.  I did write down her diagnoses and recommended treatment.  She will let us know how she would like to proceed in the future.  Questions were encouraged and answered

## 2020-09-08 ENCOUNTER — Ambulatory Visit (INDEPENDENT_AMBULATORY_CARE_PROVIDER_SITE_OTHER): Payer: Medicaid Other | Admitting: Orthopaedic Surgery

## 2020-09-08 ENCOUNTER — Encounter: Payer: Self-pay | Admitting: Orthopaedic Surgery

## 2020-09-08 ENCOUNTER — Other Ambulatory Visit: Payer: Self-pay

## 2020-09-08 DIAGNOSIS — G5621 Lesion of ulnar nerve, right upper limb: Secondary | ICD-10-CM

## 2020-09-08 DIAGNOSIS — G5601 Carpal tunnel syndrome, right upper limb: Secondary | ICD-10-CM | POA: Diagnosis not present

## 2020-09-08 DIAGNOSIS — M65311 Trigger thumb, right thumb: Secondary | ICD-10-CM | POA: Diagnosis not present

## 2020-09-08 NOTE — Progress Notes (Signed)
The patient comes in for follow-up after we had recommended considering a right ulnar nerve decompression at the cubital tunnel.  We also saw her for mild carpal tunnel syndrome on the right and trigger thumb on the right.  We had told her that surgery is an option.  She was then given follow-up appointment in 2 weeks.  She states she is unsure why she is here.  She is not interested in surgery right now.  I did talk to her in detail and she is a diabetic.  I see no hemoglobin A1c and apparently she does have a primary care visit at some point but it sounds to me like her daily blood glucose fluctuates quite a bit and has had several days where she is over 200.  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  She says her hand does feel better but there is still pain at her A1 pulley on the right thumb.  He still has a positive Tinel's sign of the right cubital tunnel and her hand and fingers are just stiff in general.  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  My standpoint I would not recommend surgery since I am not sure right now that she truly understands her situation.  I still her discuss things with family and at some point if she returns then she will have family monitors with her.  For now feel like it is most important that she watch her blood glucose control closely I recommended that to her several times during this visit.

## 2020-09-13 DIAGNOSIS — Z419 Encounter for procedure for purposes other than remedying health state, unspecified: Secondary | ICD-10-CM | POA: Diagnosis not present

## 2020-10-14 DIAGNOSIS — Z419 Encounter for procedure for purposes other than remedying health state, unspecified: Secondary | ICD-10-CM | POA: Diagnosis not present

## 2020-10-22 ENCOUNTER — Other Ambulatory Visit: Payer: Self-pay | Admitting: Internal Medicine

## 2020-10-22 DIAGNOSIS — J069 Acute upper respiratory infection, unspecified: Secondary | ICD-10-CM | POA: Diagnosis not present

## 2020-10-22 DIAGNOSIS — Z20822 Contact with and (suspected) exposure to covid-19: Secondary | ICD-10-CM | POA: Diagnosis not present

## 2020-10-22 DIAGNOSIS — I10 Essential (primary) hypertension: Secondary | ICD-10-CM | POA: Diagnosis not present

## 2020-10-22 DIAGNOSIS — E1140 Type 2 diabetes mellitus with diabetic neuropathy, unspecified: Secondary | ICD-10-CM | POA: Diagnosis not present

## 2020-10-22 DIAGNOSIS — E538 Deficiency of other specified B group vitamins: Secondary | ICD-10-CM | POA: Diagnosis not present

## 2020-10-22 DIAGNOSIS — Z20828 Contact with and (suspected) exposure to other viral communicable diseases: Secondary | ICD-10-CM | POA: Diagnosis not present

## 2020-10-23 LAB — SARS-COV-2 RNA,(COVID-19) QUALITATIVE NAAT: SARS CoV2 RNA: DETECTED — AB

## 2020-10-31 DIAGNOSIS — I10 Essential (primary) hypertension: Secondary | ICD-10-CM | POA: Diagnosis not present

## 2020-10-31 DIAGNOSIS — E1140 Type 2 diabetes mellitus with diabetic neuropathy, unspecified: Secondary | ICD-10-CM | POA: Diagnosis not present

## 2020-10-31 DIAGNOSIS — M179 Osteoarthritis of knee, unspecified: Secondary | ICD-10-CM | POA: Diagnosis not present

## 2020-11-11 DIAGNOSIS — Z419 Encounter for procedure for purposes other than remedying health state, unspecified: Secondary | ICD-10-CM | POA: Diagnosis not present

## 2020-12-05 DIAGNOSIS — E1140 Type 2 diabetes mellitus with diabetic neuropathy, unspecified: Secondary | ICD-10-CM | POA: Diagnosis not present

## 2020-12-05 DIAGNOSIS — K219 Gastro-esophageal reflux disease without esophagitis: Secondary | ICD-10-CM | POA: Diagnosis not present

## 2020-12-05 DIAGNOSIS — I10 Essential (primary) hypertension: Secondary | ICD-10-CM | POA: Diagnosis not present

## 2020-12-05 DIAGNOSIS — M5136 Other intervertebral disc degeneration, lumbar region: Secondary | ICD-10-CM | POA: Diagnosis not present

## 2020-12-05 DIAGNOSIS — E538 Deficiency of other specified B group vitamins: Secondary | ICD-10-CM | POA: Diagnosis not present

## 2020-12-12 DIAGNOSIS — Z419 Encounter for procedure for purposes other than remedying health state, unspecified: Secondary | ICD-10-CM | POA: Diagnosis not present

## 2021-01-11 DIAGNOSIS — Z419 Encounter for procedure for purposes other than remedying health state, unspecified: Secondary | ICD-10-CM | POA: Diagnosis not present

## 2021-01-19 DIAGNOSIS — I10 Essential (primary) hypertension: Secondary | ICD-10-CM | POA: Diagnosis not present

## 2021-01-19 DIAGNOSIS — J019 Acute sinusitis, unspecified: Secondary | ICD-10-CM | POA: Diagnosis not present

## 2021-01-19 DIAGNOSIS — E538 Deficiency of other specified B group vitamins: Secondary | ICD-10-CM | POA: Diagnosis not present

## 2021-01-19 DIAGNOSIS — J209 Acute bronchitis, unspecified: Secondary | ICD-10-CM | POA: Diagnosis not present

## 2021-01-19 DIAGNOSIS — E1140 Type 2 diabetes mellitus with diabetic neuropathy, unspecified: Secondary | ICD-10-CM | POA: Diagnosis not present

## 2021-02-02 DIAGNOSIS — L298 Other pruritus: Secondary | ICD-10-CM | POA: Diagnosis not present

## 2021-02-02 DIAGNOSIS — R071 Chest pain on breathing: Secondary | ICD-10-CM | POA: Diagnosis not present

## 2021-02-02 DIAGNOSIS — I10 Essential (primary) hypertension: Secondary | ICD-10-CM | POA: Diagnosis not present

## 2021-02-02 DIAGNOSIS — E1140 Type 2 diabetes mellitus with diabetic neuropathy, unspecified: Secondary | ICD-10-CM | POA: Diagnosis not present

## 2021-02-11 DIAGNOSIS — Z419 Encounter for procedure for purposes other than remedying health state, unspecified: Secondary | ICD-10-CM | POA: Diagnosis not present

## 2021-03-11 DIAGNOSIS — E1140 Type 2 diabetes mellitus with diabetic neuropathy, unspecified: Secondary | ICD-10-CM | POA: Diagnosis not present

## 2021-03-11 DIAGNOSIS — K219 Gastro-esophageal reflux disease without esophagitis: Secondary | ICD-10-CM | POA: Diagnosis not present

## 2021-03-11 DIAGNOSIS — J302 Other seasonal allergic rhinitis: Secondary | ICD-10-CM | POA: Diagnosis not present

## 2021-03-11 DIAGNOSIS — I10 Essential (primary) hypertension: Secondary | ICD-10-CM | POA: Diagnosis not present

## 2021-03-11 DIAGNOSIS — M5136 Other intervertebral disc degeneration, lumbar region: Secondary | ICD-10-CM | POA: Diagnosis not present

## 2021-05-08 DIAGNOSIS — D511 Vitamin B12 deficiency anemia due to selective vitamin B12 malabsorption with proteinuria: Secondary | ICD-10-CM | POA: Diagnosis not present

## 2021-06-29 ENCOUNTER — Other Ambulatory Visit: Payer: Self-pay | Admitting: Internal Medicine

## 2021-06-29 DIAGNOSIS — I10 Essential (primary) hypertension: Secondary | ICD-10-CM | POA: Diagnosis not present

## 2021-06-29 DIAGNOSIS — E559 Vitamin D deficiency, unspecified: Secondary | ICD-10-CM | POA: Diagnosis not present

## 2021-06-29 DIAGNOSIS — E1140 Type 2 diabetes mellitus with diabetic neuropathy, unspecified: Secondary | ICD-10-CM | POA: Diagnosis not present

## 2021-06-29 DIAGNOSIS — E1040 Type 1 diabetes mellitus with diabetic neuropathy, unspecified: Secondary | ICD-10-CM | POA: Diagnosis not present

## 2021-06-29 DIAGNOSIS — E538 Deficiency of other specified B group vitamins: Secondary | ICD-10-CM | POA: Diagnosis not present

## 2021-06-29 DIAGNOSIS — Z1239 Encounter for other screening for malignant neoplasm of breast: Secondary | ICD-10-CM | POA: Diagnosis not present

## 2021-06-29 DIAGNOSIS — Z0000 Encounter for general adult medical examination without abnormal findings: Secondary | ICD-10-CM | POA: Diagnosis not present

## 2021-06-29 DIAGNOSIS — E2839 Other primary ovarian failure: Secondary | ICD-10-CM | POA: Diagnosis not present

## 2021-06-29 DIAGNOSIS — J302 Other seasonal allergic rhinitis: Secondary | ICD-10-CM | POA: Diagnosis not present

## 2021-06-30 LAB — COMPLETE METABOLIC PANEL WITH GFR
AG Ratio: 1.4 (calc) (ref 1.0–2.5)
ALT: 23 U/L (ref 6–29)
AST: 22 U/L (ref 10–35)
Albumin: 4.1 g/dL (ref 3.6–5.1)
Alkaline phosphatase (APISO): 98 U/L (ref 37–153)
BUN: 13 mg/dL (ref 7–25)
CO2: 20 mmol/L (ref 20–32)
Calcium: 8.8 mg/dL (ref 8.6–10.4)
Chloride: 102 mmol/L (ref 98–110)
Creat: 0.89 mg/dL (ref 0.50–1.03)
Globulin: 2.9 g/dL (calc) (ref 1.9–3.7)
Glucose, Bld: 329 mg/dL — ABNORMAL HIGH (ref 65–99)
Potassium: 3.8 mmol/L (ref 3.5–5.3)
Sodium: 138 mmol/L (ref 135–146)
Total Bilirubin: 0.3 mg/dL (ref 0.2–1.2)
Total Protein: 7 g/dL (ref 6.1–8.1)
eGFR: 76 mL/min/{1.73_m2} (ref >=60)

## 2021-06-30 LAB — LIPID PANEL
Cholesterol: 151 mg/dL (ref <200)
HDL: 49 mg/dL — ABNORMAL LOW (ref >=50)
LDL Cholesterol (Calc): 76 mg/dL (calc)
Non-HDL Cholesterol (Calc): 102 mg/dL (calc) (ref <130)
Total CHOL/HDL Ratio: 3.1 (calc) (ref <5.0)
Triglycerides: 154 mg/dL — ABNORMAL HIGH (ref <150)

## 2021-06-30 LAB — VITAMIN D 25 HYDROXY (VIT D DEFICIENCY, FRACTURES): Vit D, 25-Hydroxy: 31 ng/mL (ref 30–100)

## 2021-06-30 LAB — CBC
HCT: 40.1 % (ref 35.0–45.0)
Hemoglobin: 13.2 g/dL (ref 11.7–15.5)
MCH: 25.9 pg — ABNORMAL LOW (ref 27.0–33.0)
MCHC: 32.9 g/dL (ref 32.0–36.0)
MCV: 78.6 fL — ABNORMAL LOW (ref 80.0–100.0)
MPV: 11.9 fL (ref 7.5–12.5)
Platelets: 213 10*3/uL (ref 140–400)
RBC: 5.1 10*6/uL (ref 3.80–5.10)
RDW: 13.2 % (ref 11.0–15.0)
WBC: 11.8 10*3/uL — ABNORMAL HIGH (ref 3.8–10.8)

## 2021-07-02 ENCOUNTER — Other Ambulatory Visit: Payer: Self-pay | Admitting: Internal Medicine

## 2021-07-02 DIAGNOSIS — Z1231 Encounter for screening mammogram for malignant neoplasm of breast: Secondary | ICD-10-CM

## 2021-07-02 DIAGNOSIS — E2839 Other primary ovarian failure: Secondary | ICD-10-CM

## 2021-08-10 DIAGNOSIS — K219 Gastro-esophageal reflux disease without esophagitis: Secondary | ICD-10-CM | POA: Diagnosis not present

## 2021-08-10 DIAGNOSIS — E1140 Type 2 diabetes mellitus with diabetic neuropathy, unspecified: Secondary | ICD-10-CM | POA: Diagnosis not present

## 2021-08-10 DIAGNOSIS — M7060 Trochanteric bursitis, unspecified hip: Secondary | ICD-10-CM | POA: Diagnosis not present

## 2021-08-10 DIAGNOSIS — E538 Deficiency of other specified B group vitamins: Secondary | ICD-10-CM | POA: Diagnosis not present

## 2021-08-10 DIAGNOSIS — I10 Essential (primary) hypertension: Secondary | ICD-10-CM | POA: Diagnosis not present

## 2021-09-18 IMAGING — CR DG CHEST 2V
2 series · 2 of 2 positions shown · non-contrast
Comparison: 08/07/2018

CLINICAL DATA: Chest pain radiating posteriorly

EXAM:
CHEST - 2 VIEW

[w chest pa]
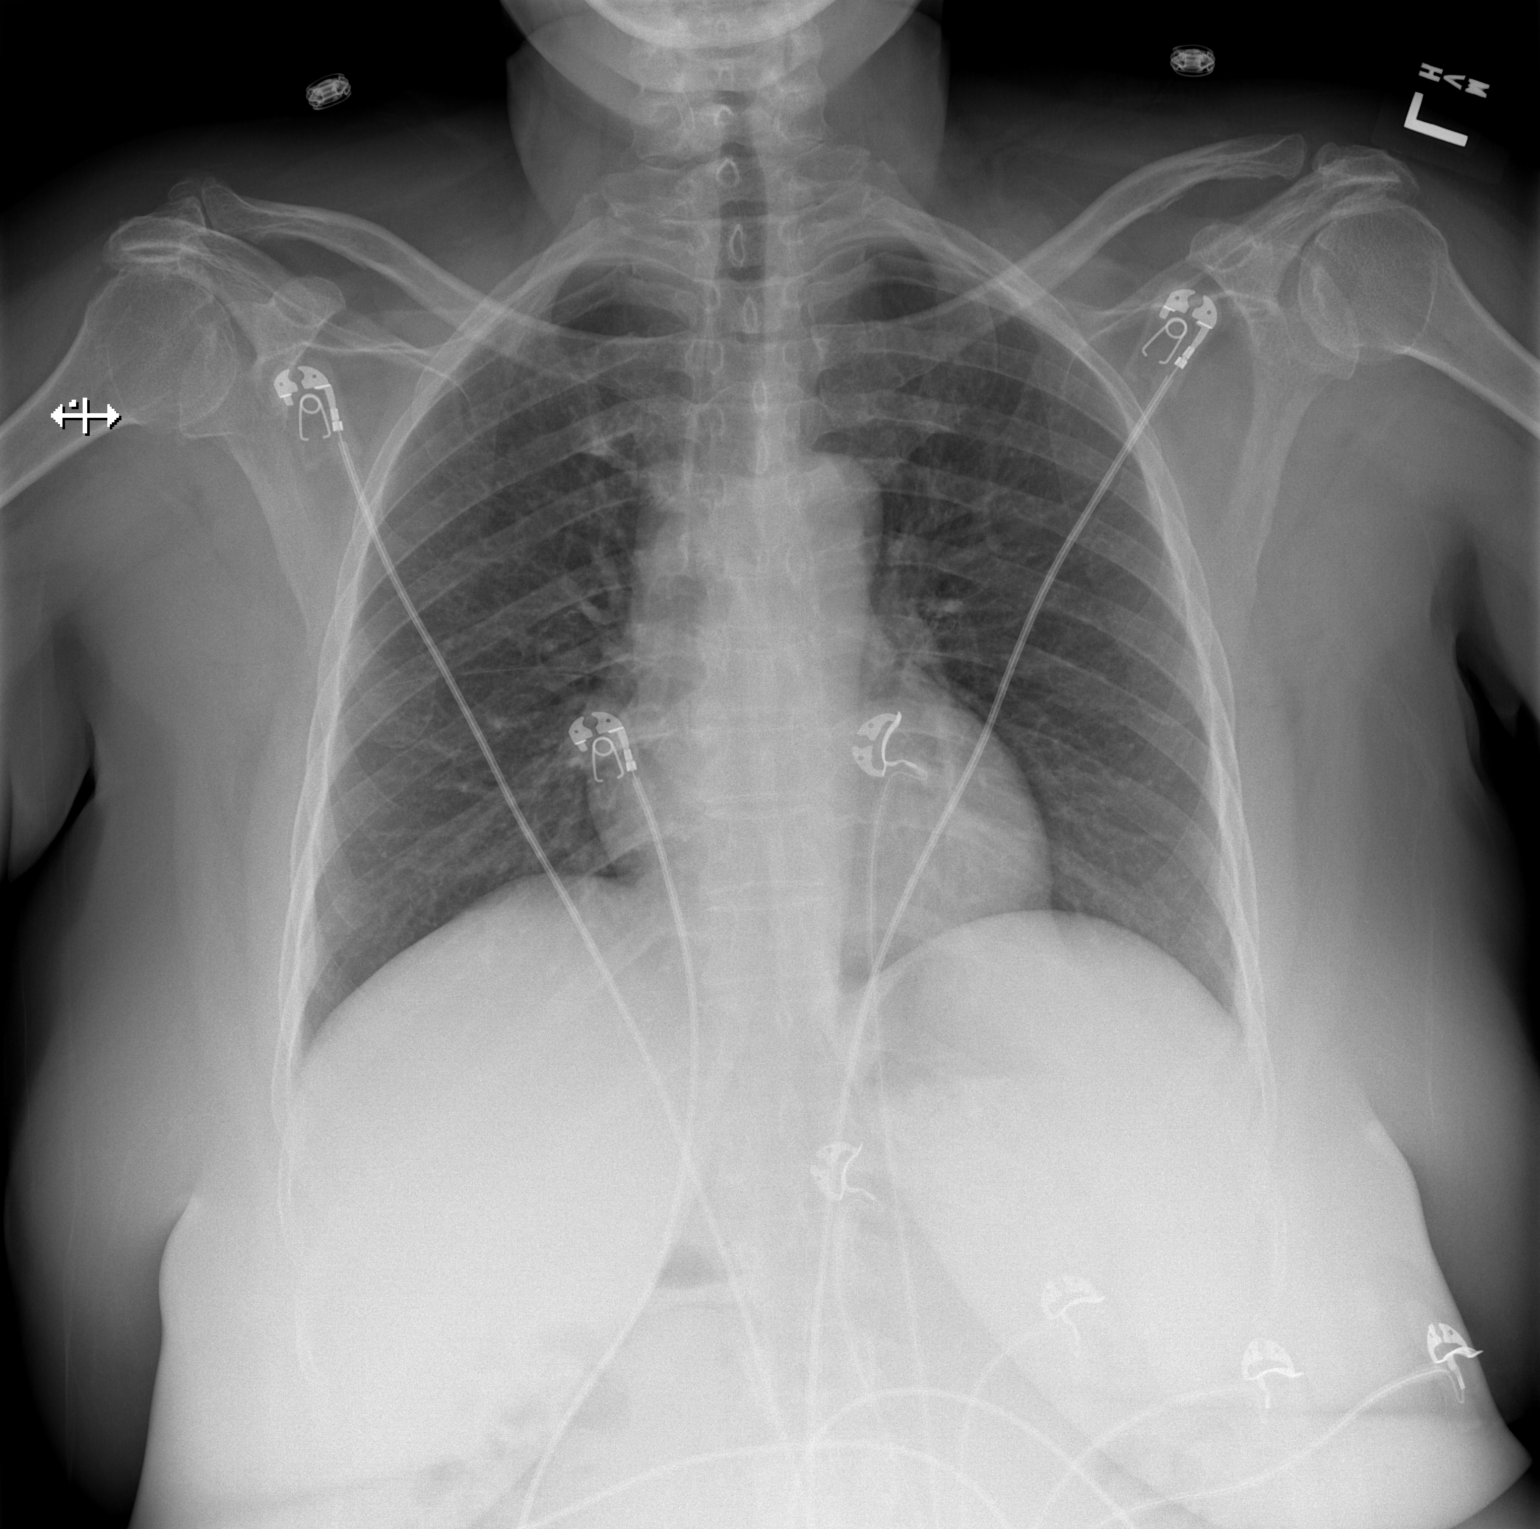

[w chest lat]
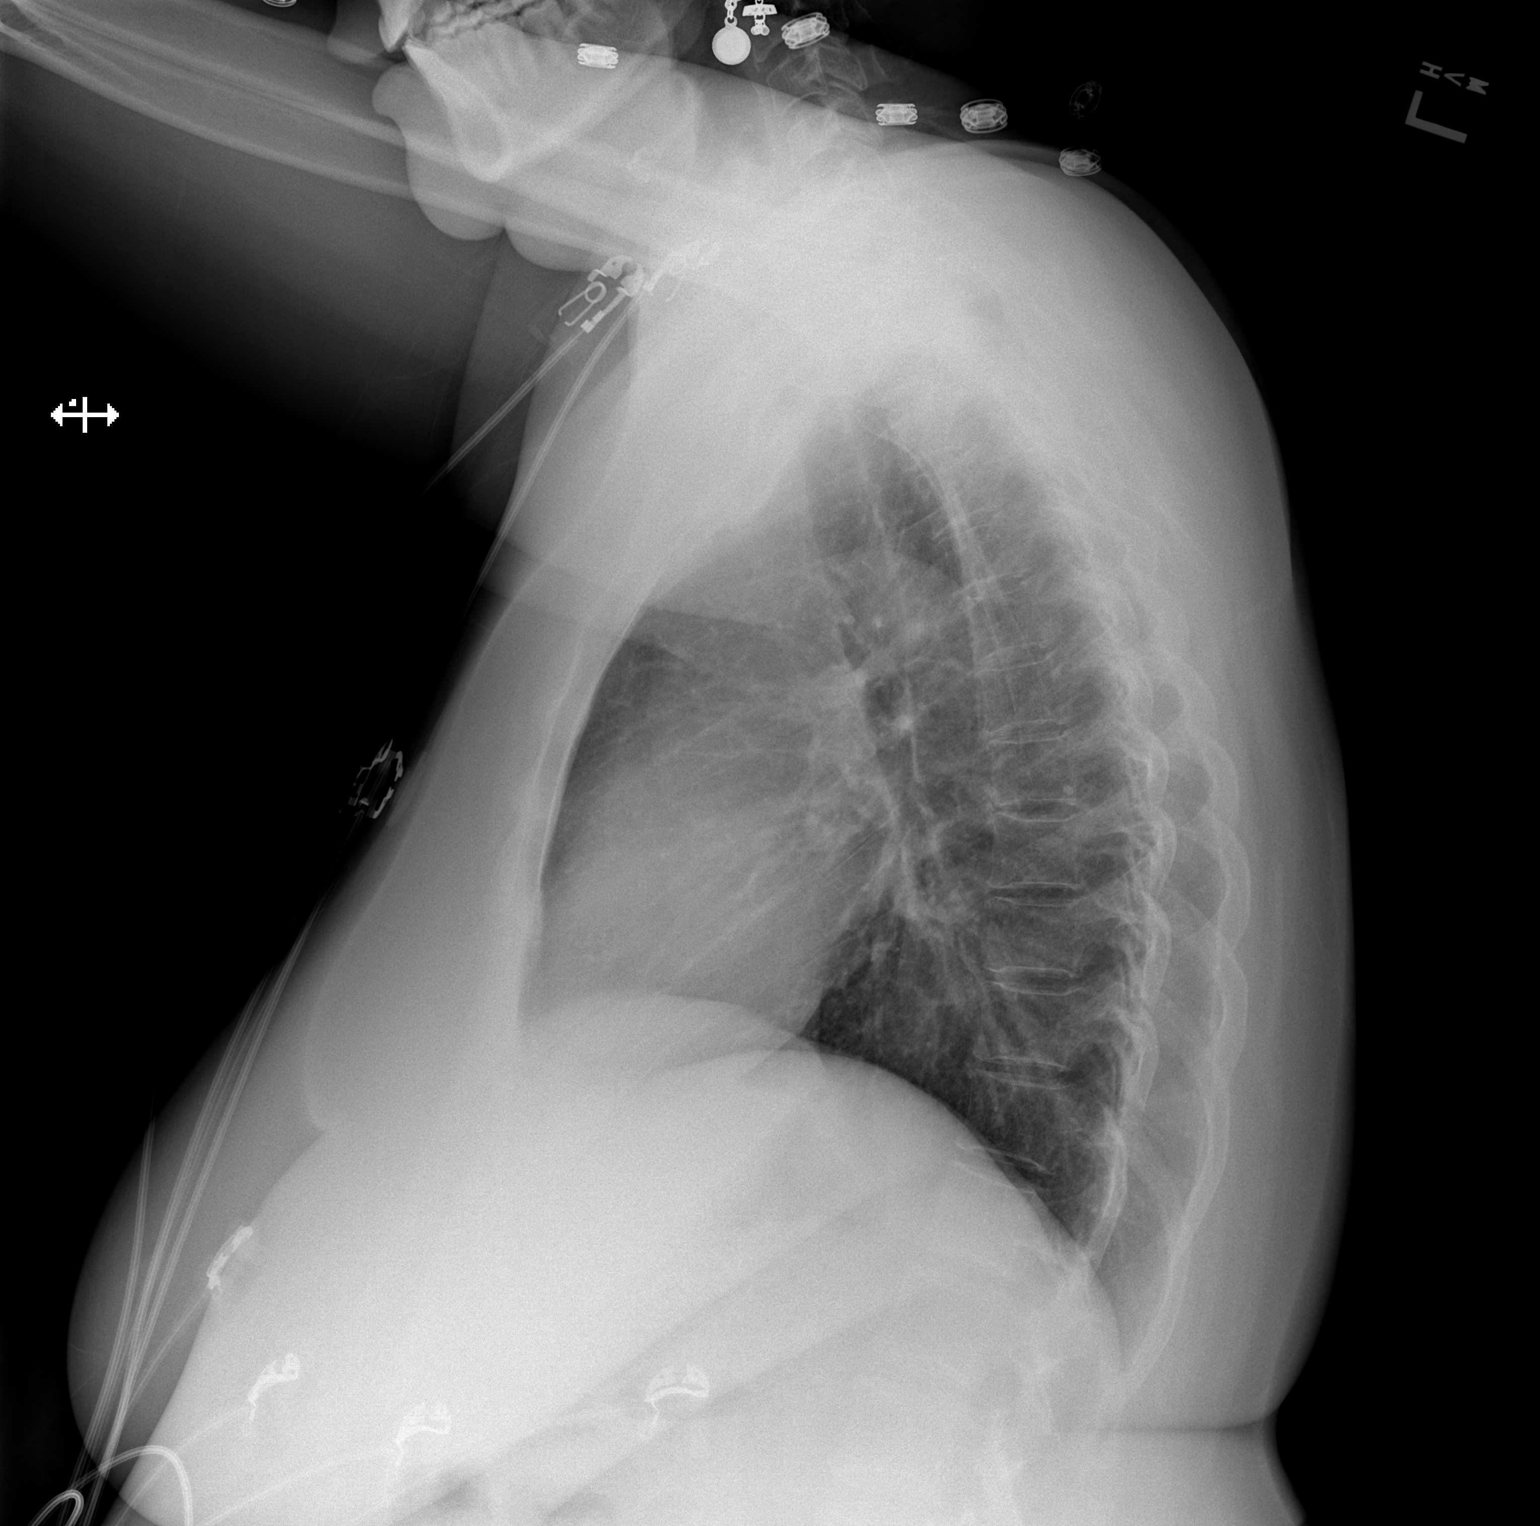

[2 of 2 positions shown; findings below may reference images not displayed]

FINDINGS: The heart size and mediastinal contours are within normal limits.
Both lungs are clear. The visualized skeletal structures are
unremarkable.
IMPRESSION: No active cardiopulmonary disease.

## 2021-09-30 DIAGNOSIS — J302 Other seasonal allergic rhinitis: Secondary | ICD-10-CM | POA: Diagnosis not present

## 2021-09-30 DIAGNOSIS — H6121 Impacted cerumen, right ear: Secondary | ICD-10-CM | POA: Diagnosis not present

## 2021-09-30 DIAGNOSIS — K219 Gastro-esophageal reflux disease without esophagitis: Secondary | ICD-10-CM | POA: Diagnosis not present

## 2021-09-30 DIAGNOSIS — I10 Essential (primary) hypertension: Secondary | ICD-10-CM | POA: Diagnosis not present

## 2021-09-30 DIAGNOSIS — E538 Deficiency of other specified B group vitamins: Secondary | ICD-10-CM | POA: Diagnosis not present

## 2021-09-30 DIAGNOSIS — E1140 Type 2 diabetes mellitus with diabetic neuropathy, unspecified: Secondary | ICD-10-CM | POA: Diagnosis not present

## 2021-10-14 DIAGNOSIS — I10 Essential (primary) hypertension: Secondary | ICD-10-CM | POA: Diagnosis not present

## 2021-10-14 DIAGNOSIS — E1140 Type 2 diabetes mellitus with diabetic neuropathy, unspecified: Secondary | ICD-10-CM | POA: Diagnosis not present

## 2021-10-14 DIAGNOSIS — N760 Acute vaginitis: Secondary | ICD-10-CM | POA: Diagnosis not present

## 2021-10-21 DIAGNOSIS — E1140 Type 2 diabetes mellitus with diabetic neuropathy, unspecified: Secondary | ICD-10-CM | POA: Diagnosis not present

## 2021-10-21 DIAGNOSIS — I10 Essential (primary) hypertension: Secondary | ICD-10-CM | POA: Diagnosis not present

## 2021-11-04 DIAGNOSIS — E1140 Type 2 diabetes mellitus with diabetic neuropathy, unspecified: Secondary | ICD-10-CM | POA: Diagnosis not present

## 2021-11-04 DIAGNOSIS — I10 Essential (primary) hypertension: Secondary | ICD-10-CM | POA: Diagnosis not present

## 2021-12-09 DIAGNOSIS — E538 Deficiency of other specified B group vitamins: Secondary | ICD-10-CM | POA: Diagnosis not present

## 2021-12-09 DIAGNOSIS — E1140 Type 2 diabetes mellitus with diabetic neuropathy, unspecified: Secondary | ICD-10-CM | POA: Diagnosis not present

## 2021-12-09 DIAGNOSIS — K219 Gastro-esophageal reflux disease without esophagitis: Secondary | ICD-10-CM | POA: Diagnosis not present

## 2021-12-09 DIAGNOSIS — J302 Other seasonal allergic rhinitis: Secondary | ICD-10-CM | POA: Diagnosis not present

## 2021-12-09 DIAGNOSIS — I10 Essential (primary) hypertension: Secondary | ICD-10-CM | POA: Diagnosis not present

## 2022-01-15 DIAGNOSIS — I10 Essential (primary) hypertension: Secondary | ICD-10-CM | POA: Diagnosis not present

## 2022-01-15 DIAGNOSIS — M5136 Other intervertebral disc degeneration, lumbar region: Secondary | ICD-10-CM | POA: Diagnosis not present

## 2022-01-15 DIAGNOSIS — J302 Other seasonal allergic rhinitis: Secondary | ICD-10-CM | POA: Diagnosis not present

## 2022-01-15 DIAGNOSIS — K219 Gastro-esophageal reflux disease without esophagitis: Secondary | ICD-10-CM | POA: Diagnosis not present

## 2022-01-15 DIAGNOSIS — E1140 Type 2 diabetes mellitus with diabetic neuropathy, unspecified: Secondary | ICD-10-CM | POA: Diagnosis not present

## 2022-01-30 IMAGING — CR DG SHOULDER 2+V*R*
3 series · 3 of 3 positions shown · non-contrast
Comparison: Shoulder radiograph 03/08/2013

CLINICAL DATA: Pain.  Right arm and hand weakness for 3 weeks.

EXAM:
RIGHT SHOULDER - 2+ VIEW

[x shoulder axillary right]
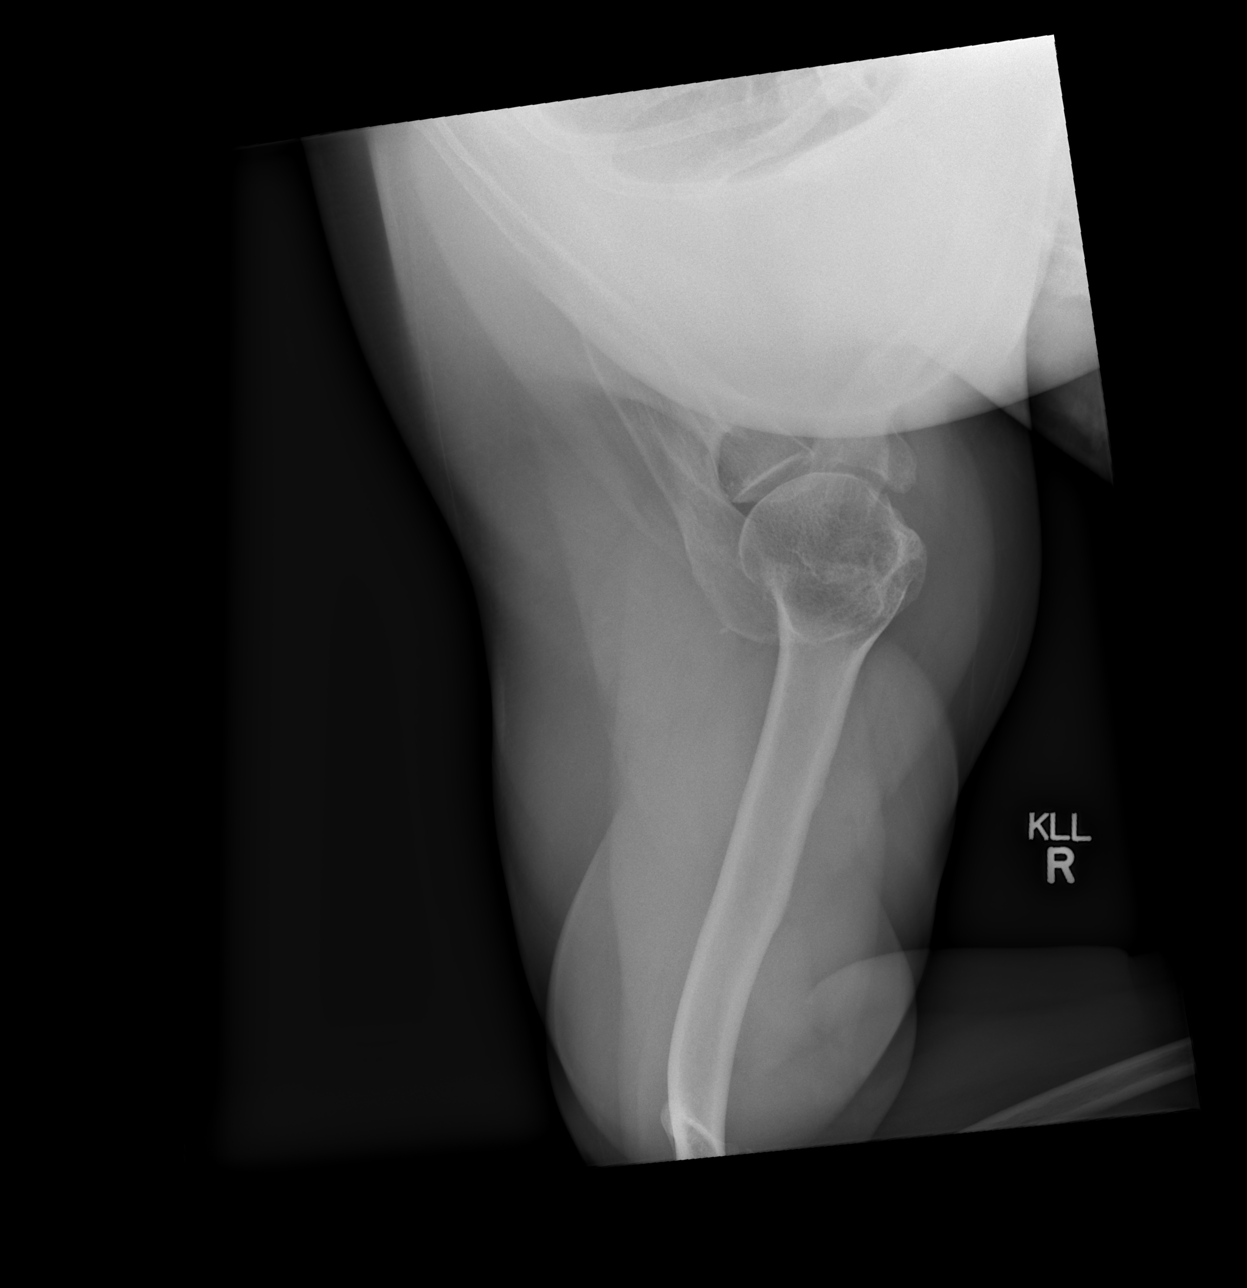

[x shoulder ap right (1 of 2)]
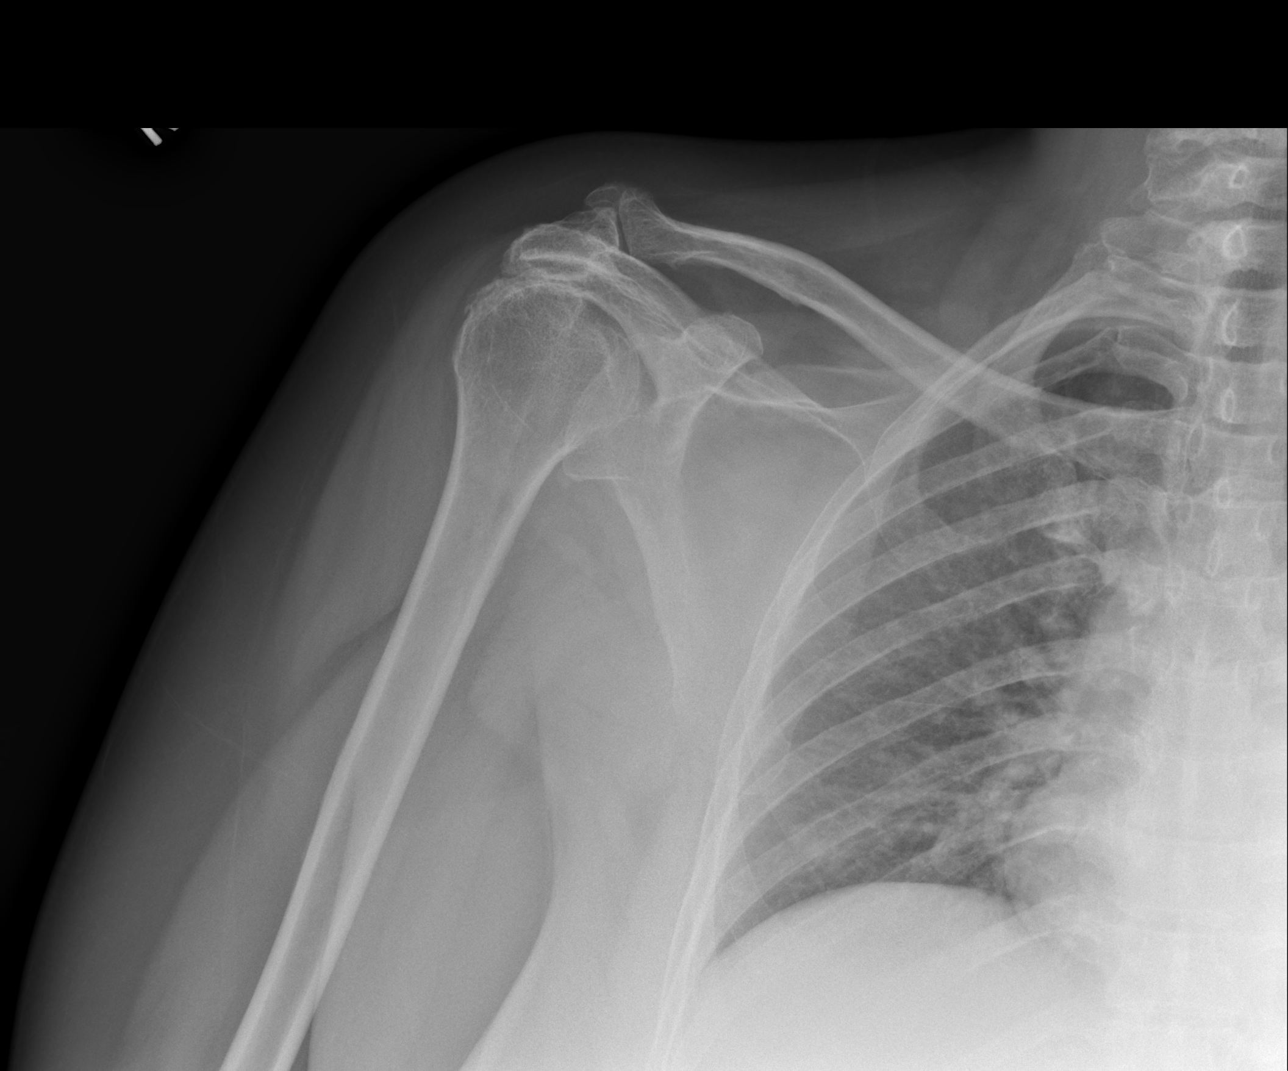

[x shoulder ap right (2 of 2)]
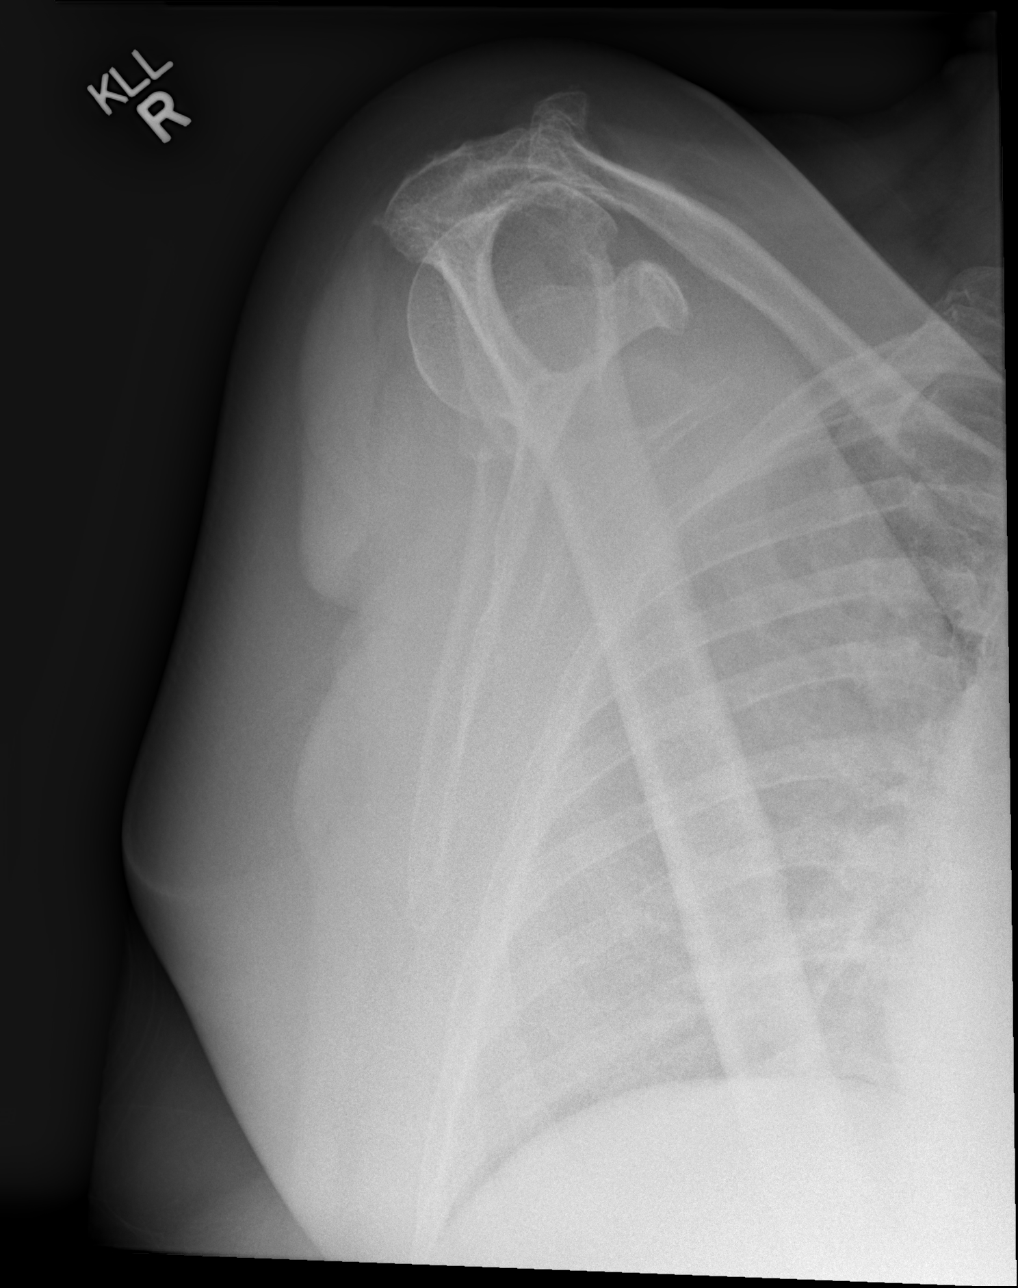

[3 of 3 positions shown; findings below may reference images not displayed]

FINDINGS: No acute fracture. No dislocation. There is superior subluxation of
the humeral head abutting the undersurface of the acromion. Mild
subcortical cystic change involving the lateral humeral head.
Glenohumeral joint is unremarkable. Mild acromioclavicular
degenerative change. No soft tissue calcification or focal soft
tissue abnormality. No erosion or bony destruction.
IMPRESSION: 1. Superior subluxation of the humeral head abutting the
undersurface of the acromion consistent with rotator cuff
arthropathy.
2. Mild acromioclavicular degenerative change.

## 2022-03-01 DIAGNOSIS — J302 Other seasonal allergic rhinitis: Secondary | ICD-10-CM | POA: Diagnosis not present

## 2022-03-01 DIAGNOSIS — E538 Deficiency of other specified B group vitamins: Secondary | ICD-10-CM | POA: Diagnosis not present

## 2022-03-01 DIAGNOSIS — I10 Essential (primary) hypertension: Secondary | ICD-10-CM | POA: Diagnosis not present

## 2022-03-01 DIAGNOSIS — K219 Gastro-esophageal reflux disease without esophagitis: Secondary | ICD-10-CM | POA: Diagnosis not present

## 2022-03-01 DIAGNOSIS — E1140 Type 2 diabetes mellitus with diabetic neuropathy, unspecified: Secondary | ICD-10-CM | POA: Diagnosis not present

## 2022-03-31 DIAGNOSIS — E1140 Type 2 diabetes mellitus with diabetic neuropathy, unspecified: Secondary | ICD-10-CM | POA: Diagnosis not present

## 2022-03-31 DIAGNOSIS — K219 Gastro-esophageal reflux disease without esophagitis: Secondary | ICD-10-CM | POA: Diagnosis not present

## 2022-03-31 DIAGNOSIS — E538 Deficiency of other specified B group vitamins: Secondary | ICD-10-CM | POA: Diagnosis not present

## 2022-03-31 DIAGNOSIS — I10 Essential (primary) hypertension: Secondary | ICD-10-CM | POA: Diagnosis not present

## 2022-04-14 DIAGNOSIS — E785 Hyperlipidemia, unspecified: Secondary | ICD-10-CM | POA: Diagnosis not present

## 2022-04-14 DIAGNOSIS — Z7984 Long term (current) use of oral hypoglycemic drugs: Secondary | ICD-10-CM | POA: Diagnosis not present

## 2022-04-14 DIAGNOSIS — I10 Essential (primary) hypertension: Secondary | ICD-10-CM | POA: Diagnosis not present

## 2022-04-14 DIAGNOSIS — M7918 Myalgia, other site: Secondary | ICD-10-CM | POA: Diagnosis not present

## 2022-04-14 DIAGNOSIS — M1990 Unspecified osteoarthritis, unspecified site: Secondary | ICD-10-CM | POA: Diagnosis not present

## 2022-04-14 DIAGNOSIS — Z794 Long term (current) use of insulin: Secondary | ICD-10-CM | POA: Diagnosis not present

## 2022-04-14 DIAGNOSIS — Z791 Long term (current) use of non-steroidal anti-inflammatories (NSAID): Secondary | ICD-10-CM | POA: Diagnosis not present

## 2022-04-14 DIAGNOSIS — E1142 Type 2 diabetes mellitus with diabetic polyneuropathy: Secondary | ICD-10-CM | POA: Diagnosis not present

## 2022-04-14 DIAGNOSIS — J302 Other seasonal allergic rhinitis: Secondary | ICD-10-CM | POA: Diagnosis not present

## 2022-04-14 DIAGNOSIS — K219 Gastro-esophageal reflux disease without esophagitis: Secondary | ICD-10-CM | POA: Diagnosis not present

## 2022-05-03 ENCOUNTER — Encounter (HOSPITAL_COMMUNITY): Payer: Self-pay

## 2022-05-03 ENCOUNTER — Emergency Department (HOSPITAL_COMMUNITY)
Admission: EM | Admit: 2022-05-03 | Discharge: 2022-05-04 | Disposition: A | Discharge status: Home / Self Care | Payer: Medicaid Other | Attending: Emergency Medicine | Admitting: Emergency Medicine

## 2022-05-03 ENCOUNTER — Other Ambulatory Visit: Payer: Self-pay

## 2022-05-03 ENCOUNTER — Emergency Department (HOSPITAL_COMMUNITY): Payer: Medicaid Other

## 2022-05-03 DIAGNOSIS — K3189 Other diseases of stomach and duodenum: Secondary | ICD-10-CM | POA: Diagnosis not present

## 2022-05-03 DIAGNOSIS — R112 Nausea with vomiting, unspecified: Secondary | ICD-10-CM | POA: Diagnosis not present

## 2022-05-03 DIAGNOSIS — E1165 Type 2 diabetes mellitus with hyperglycemia: Secondary | ICD-10-CM | POA: Insufficient documentation

## 2022-05-03 DIAGNOSIS — Z7984 Long term (current) use of oral hypoglycemic drugs: Secondary | ICD-10-CM | POA: Insufficient documentation

## 2022-05-03 DIAGNOSIS — D72829 Elevated white blood cell count, unspecified: Secondary | ICD-10-CM | POA: Insufficient documentation

## 2022-05-03 DIAGNOSIS — J984 Other disorders of lung: Secondary | ICD-10-CM | POA: Diagnosis not present

## 2022-05-03 DIAGNOSIS — Z794 Long term (current) use of insulin: Secondary | ICD-10-CM | POA: Diagnosis not present

## 2022-05-03 DIAGNOSIS — K760 Fatty (change of) liver, not elsewhere classified: Secondary | ICD-10-CM | POA: Diagnosis not present

## 2022-05-03 DIAGNOSIS — R079 Chest pain, unspecified: Secondary | ICD-10-CM | POA: Diagnosis not present

## 2022-05-03 DIAGNOSIS — R1013 Epigastric pain: Secondary | ICD-10-CM | POA: Diagnosis not present

## 2022-05-03 DIAGNOSIS — R252 Cramp and spasm: Secondary | ICD-10-CM | POA: Insufficient documentation

## 2022-05-03 DIAGNOSIS — R1084 Generalized abdominal pain: Secondary | ICD-10-CM | POA: Insufficient documentation

## 2022-05-03 DIAGNOSIS — R1110 Vomiting, unspecified: Secondary | ICD-10-CM

## 2022-05-03 DIAGNOSIS — R197 Diarrhea, unspecified: Secondary | ICD-10-CM | POA: Diagnosis not present

## 2022-05-03 DIAGNOSIS — R1011 Right upper quadrant pain: Secondary | ICD-10-CM | POA: Diagnosis not present

## 2022-05-03 LAB — CBC
HCT: 40 % (ref 36.0–46.0)
Hemoglobin: 13.3 g/dL (ref 12.0–15.0)
MCH: 25.2 pg — ABNORMAL LOW (ref 26.0–34.0)
MCHC: 33.3 g/dL (ref 30.0–36.0)
MCV: 75.9 fL — ABNORMAL LOW (ref 80.0–100.0)
Platelets: 207 10*3/uL (ref 150–400)
RBC: 5.27 MIL/uL — ABNORMAL HIGH (ref 3.87–5.11)
RDW: 13.3 % (ref 11.5–15.5)
WBC: 14.6 10*3/uL — ABNORMAL HIGH (ref 4.0–10.5)
nRBC: 0 % (ref 0.0–0.2)

## 2022-05-03 LAB — COMPREHENSIVE METABOLIC PANEL
ALT: 18 U/L (ref 0–44)
AST: 16 U/L (ref 15–41)
Albumin: 3.5 g/dL (ref 3.5–5.0)
Alkaline Phosphatase: 85 U/L (ref 38–126)
Anion gap: 10 (ref 5–15)
BUN: 9 mg/dL (ref 6–20)
CO2: 23 mmol/L (ref 22–32)
Calcium: 9.1 mg/dL (ref 8.9–10.3)
Chloride: 106 mmol/L (ref 98–111)
Creatinine, Ser: 0.83 mg/dL (ref 0.44–1.00)
GFR, Estimated: >60 mL/min (ref >60)
Glucose, Bld: 171 mg/dL — ABNORMAL HIGH (ref 70–99)
Potassium: 3.5 mmol/L (ref 3.5–5.1)
Sodium: 139 mmol/L (ref 135–145)
Total Bilirubin: 0.6 mg/dL (ref 0.3–1.2)
Total Protein: 7.5 g/dL (ref 6.5–8.1)

## 2022-05-03 LAB — URINALYSIS, ROUTINE W REFLEX MICROSCOPIC
Bilirubin Urine: NEGATIVE
Glucose, UA: NEGATIVE mg/dL
Hgb urine dipstick: NEGATIVE
Ketones, ur: 20 mg/dL — AB
Nitrite: NEGATIVE
Protein, ur: 30 mg/dL — AB
Specific Gravity, Urine: 1.027 (ref 1.005–1.030)
pH: 5 (ref 5.0–8.0)

## 2022-05-03 LAB — I-STAT BETA HCG BLOOD, ED (MC, WL, AP ONLY): I-stat hCG, quantitative: <5 m[IU]/mL (ref <5)

## 2022-05-03 LAB — LIPASE, BLOOD: Lipase: 29 U/L (ref 11–51)

## 2022-05-03 MED ORDER — IOHEXOL 300 MG/ML  SOLN
100.0000 mL | Freq: Once | INTRAMUSCULAR | Status: AC | PRN
  Administered 2022-05-03: 100 mL via INTRAVENOUS

## 2022-05-03 NOTE — ED Triage Notes (Signed)
Reports abd pain x 1 week but rapidly increased last night with n/v

## 2022-05-03 NOTE — ED Provider Triage Note (Signed)
Emergency Medicine Provider Triage Evaluation Note  Rachel Robertson , a 56 y.o. female  was evaluated in triage.  Pt complains of Epigastric abdominal pain, nausea, vomiting, diarrhea has been going on for a week but has been worse since last night around 9 PM.  She reports pain radiates into chest, back.  She reports headache as well.  She reports she does not eat anything of the ordinary, no other family members with stomach bug or food poisoning.  She reports a lot of gas and acid reflux..  Review of Systems  Positive: Abd pain, nvd Negative: Fever, chills, shob  Physical Exam  LMP 02/16/2012  Gen:   Awake, no distress   Resp:  Normal effort  MSK:   Moves extremities without difficulty  Other:  Most focally tender in epigastric region, no rebound, rigidity, guarding, some abdominal tenderness throughout.  Medical Decision Making  Medically screening exam initiated at 6:01 PM.  Appropriate orders placed.  Arsema J Hendershott was informed that the remainder of the evaluation will be completed by another provider, this initial triage assessment does not replace that evaluation, and the importance of remaining in the ED until their evaluation is complete.  Workup Initiated   Olene Floss, [place] 05/03/22 [id number]

## 2022-05-04 ENCOUNTER — Emergency Department (HOSPITAL_COMMUNITY): Payer: Medicaid Other

## 2022-05-04 DIAGNOSIS — K760 Fatty (change of) liver, not elsewhere classified: Secondary | ICD-10-CM | POA: Diagnosis not present

## 2022-05-04 DIAGNOSIS — R079 Chest pain, unspecified: Secondary | ICD-10-CM | POA: Diagnosis not present

## 2022-05-04 DIAGNOSIS — R1011 Right upper quadrant pain: Secondary | ICD-10-CM | POA: Diagnosis not present

## 2022-05-04 MED ORDER — SODIUM CHLORIDE 0.9 % IV BOLUS (SEPSIS)
1000.0000 mL | Freq: Once | INTRAVENOUS | Status: AC
  Administered 2022-05-04: 1000 mL via INTRAVENOUS

## 2022-05-04 MED ORDER — ONDANSETRON 4 MG PO TBDP: 4.0000 mg | ORAL_TABLET | Freq: Three times a day (TID) | ORAL | 0 refills | Status: DC | PRN

## 2022-05-04 MED ORDER — ONDANSETRON HCL 4 MG/2ML IJ SOLN
4.0000 mg | Freq: Once | INTRAMUSCULAR | Status: AC
  Administered 2022-05-04: 4 mg via INTRAVENOUS
  Filled 2022-05-04: qty 2

## 2022-05-04 MED ORDER — FENTANYL CITRATE PF 50 MCG/ML IJ SOSY
50.0000 ug | PREFILLED_SYRINGE | Freq: Once | INTRAMUSCULAR | Status: AC
  Administered 2022-05-04: 50 ug via INTRAVENOUS
  Filled 2022-05-04: qty 1

## 2022-05-04 MED ORDER — KETOROLAC TROMETHAMINE 15 MG/ML IJ SOLN
15.0000 mg | Freq: Once | INTRAMUSCULAR | Status: AC
Start: 2022-05-04 — End: 2022-05-04
  Administered 2022-05-04: 15 mg via INTRAVENOUS
  Filled 2022-05-04: qty 1

## 2022-05-04 NOTE — ED Notes (Signed)
Pt given crackers and water, tolerated well. No nausea reported

## 2022-05-04 NOTE — Discharge Instructions (Addendum)
You can take Tylenol every 4-6 hours for pain If no  improvement in 2 days please follow-up with your doctor

## 2022-05-04 NOTE — ED Notes (Signed)
RN reviewed discharge instructions with pt. Pt verbalized understanding and had no further questions. VSS upon discharge.  

## 2022-05-04 NOTE — ED Provider Notes (Signed)
[facility] EMERGENCY DEPARTMENT Provider Note   CSN: [id number] Arrival date & time: 05/03/22  1710     History  Chief Complaint  Patient presents with   Abdominal Pain    Rachel Robertson is a 56 y.o. female.  The history is provided by the patient and a relative.  Abdominal Pain Pain location:  Generalized Pain severity:  Moderate Onset quality:  Gradual Timing:  Intermittent Progression:  Worsening Chronicity:  New Relieved by:  Nothing Associated symptoms: nausea and vomiting   Associated symptoms: no diarrhea, no fever, no hematemesis and no hematochezia   Risk factors: obesity   Risk factors: has not had multiple surgeries    Patient presents with son who provides most of the history Has had intermittent abdominal pain for the past week, worse over the past 4 hours.  she is also having frequent vomiting and diarrhea.  No sick contacts.  No travel.    Home Medications Prior to Admission medications   Medication Sig Start Date End Date Taking? Authorizing Provider  ondansetron (ZOFRAN-ODT) 4 MG disintegrating tablet Take 1 tablet (4 mg total) by mouth every 8 (eight) hours as needed. 8mg  ODT q4 hours prn nausea 05/04/22  Yes 05/06/22, MD  B-D ULTRAFINE III SHORT PEN 31G X 8 MM MISC ADMINISTER INSULIN TID 01/26/17   [provider]  cetirizine (ZYRTEC) 10 MG tablet Take 10 mg by mouth daily as needed for allergies.    [provider]  cyanocobalamin (,VITAMIN B-12,) 1000 MCG/ML injection Inject 1,000 mcg into the skin every 30 (thirty) days.  03/08/17   [provider]  diclofenac Sodium (VOLTAREN) 1 % GEL Apply 2 g topically 4 (four) times daily as needed (pain).    [provider]  diphenoxylate-atropine (LOMOTIL) 2.5-0.025 MG tablet Take 1 tablet by mouth every 6 (six) hours as needed for diarrhea or loose stools.    [provider]  gabapentin (NEURONTIN) 100 MG capsule Take 1 capsule (100 mg total) by  mouth 3 (three) times daily. 02/02/18   02/04/18, MD  glimepiride (AMARYL) 4 MG tablet Take 4 mg by mouth 2 (two) times daily.    [provider]  hydrOXYzine (ATARAX/VISTARIL) 25 MG tablet Take 25 mg by mouth 2 (two) times daily as needed for anxiety.  11/15/19   [provider]  ibuprofen (ADVIL,MOTRIN) 800 MG tablet Take 800 mg by mouth every 8 (eight) hours as needed for mild pain.    [provider]  insulin detemir (LEVEMIR) 100 UNIT/ML FlexPen Inject 20 Units into the skin daily at 10 pm.    [provider]  losartan (COZAAR) 50 MG tablet Take 50 mg by mouth daily. 09/18/19   [provider]  methocarbamol (ROBAXIN) 750 MG tablet Take 750 mg by mouth 2 (two) times daily as needed. 08/12/20   [provider]  metoCLOPramide (REGLAN) 5 MG tablet Take 5 mg by mouth 3 (three) times daily. 03/24/20   [provider]  pantoprazole (PROTONIX) 40 MG tablet Take 40 mg by mouth 2 (two) times daily.     [provider]  simvastatin (ZOCOR) 10 MG tablet Take 10 mg by mouth at bedtime.  03/21/17   [provider]  sitaGLIPtin (JANUVIA) 100 MG tablet Take 100 mg by mouth daily.    [provider]  TRULICITY 1.5 MG/0.5ML SOPN Inject 1.5 mg into the skin once a week. 03/27/20   [provider]  enoxaparin (LOVENOX) 30  MG/0.3ML injection Inject 0.3 mLs (30 mg total) into the skin every 12 (twelve) hours. 03/12/12 03/12/12  Clemetine Marker, MD      Allergies    Metformin    Review of Systems   Review of Systems  Constitutional:  Negative for fever.  Gastrointestinal:  Positive for abdominal pain, nausea and vomiting. Negative for diarrhea, hematemesis and hematochezia.    Physical Exam Updated Vital Signs BP (!) 167/74   Pulse 67   Temp 97.6 F (36.4 C) (Oral)   Resp 20   Ht 1.651 m (5\' 5" )   Wt 72.6 kg   LMP 02/16/2012   SpO2 99%   BMI 26.63 kg/m  Physical Exam CONSTITUTIONAL: Well  developed/well nourished HEAD: Normocephalic/atraumatic EYES: EOMI/PERRL, no icterus ENMT: Mucous membranes moist NECK: supple no meningeal signs SPINE/BACK:entire spine nontender CV: S1/S2 noted, no murmurs/rubs/gallops noted LUNGS: Lungs are clear to auscultation bilaterally, no apparent distress Chest - diffuse chest wall tenderness ABDOMEN: soft, diffuse tenderness noted, moderate RUQ tenderness, no rebound or guarding, bowel sounds noted throughout abdomen GU:no cva tenderness NEURO: Pt is awake/alert/appropriate, moves all extremitiesx4.  No facial droop.   EXTREMITIES: pulses normal/equal, full ROM SKIN: warm, color normal PSYCH: no abnormalities of mood noted, alert and oriented to situation  ED Results / Procedures / Treatments   Labs (all labs ordered are listed, but only abnormal results are displayed) Labs Reviewed  COMPREHENSIVE METABOLIC PANEL - Abnormal; Notable for the following components:      Result Value   Glucose, Bld 171 (*)    All other components within normal limits  CBC - Abnormal; Notable for the following components:   WBC 14.6 (*)    RBC 5.27 (*)    MCV 75.9 (*)    MCH 25.2 (*)    All other components within normal limits  URINALYSIS, ROUTINE W REFLEX MICROSCOPIC - Abnormal; Notable for the following components:   Color, Urine AMBER (*)    APPearance HAZY (*)    Ketones, ur 20 (*)    Protein, ur 30 (*)    Leukocytes,Ua MODERATE (*)    Bacteria, UA RARE (*)    All other components within normal limits  URINE CULTURE  LIPASE, BLOOD  I-STAT BETA HCG BLOOD, ED ([facility], [facility], [facility] ONLY)    EKG EKG Interpretation  Date/Time:  Monday May 03 2022 18:15:00 EDT Ventricular Rate:  72 PR Interval:  134 QRS Duration: 82 QT Interval:  414 QTC Calculation: 453 R Axis:   68 Text Interpretation: Normal sinus rhythm Normal ECG When compared with ECG of 17-Dec-2019 19:54, No significant change since last tracing Confirmed by 19-Dec-2019 (Zadie Rhine) on  05/04/2022 2:25:09 AM  Radiology 05/06/2022 Abdomen Limited RUQ (LIVER/GB)  Result Date: 05/04/2022 CLINICAL DATA:  Right upper quadrant abdominal pain. EXAM: ULTRASOUND ABDOMEN LIMITED RIGHT UPPER QUADRANT COMPARISON:  Ultrasound dated 06/24/2008. FINDINGS: Gallbladder: No gallstones or wall thickening visualized. No sonographic Murphy sign noted by sonographer. Common bile duct: Diameter: 3 mm Liver: There is diffuse increased liver echogenicity most commonly seen in the setting of fatty infiltration. Superimposed inflammation or fibrosis is not excluded. Clinical correlation is recommended. Portal vein is patent on color Doppler imaging with normal direction of blood flow towards the liver. Other: None. IMPRESSION: Fatty liver, otherwise unremarkable right upper quadrant ultrasound. Electronically Signed   By: 08/24/2008 M.D.   On: 05/04/2022 03:28   DG Chest Port 1 View  Result Date: 05/04/2022 CLINICAL DATA:  Chest pain. EXAM: PORTABLE CHEST 1  VIEW COMPARISON:  Chest radiograph dated 12/17/2019. FINDINGS: The heart size and mediastinal contours are within normal limits. Both lungs are clear. The visualized skeletal structures are unremarkable. IMPRESSION: No active disease. Electronically Signed   By: Elgie Collard M.D.   On: 05/04/2022 02:43   CT CHEST ABDOMEN PELVIS W CONTRAST  Result Date: 05/03/2022 CLINICAL DATA:  Epigastric pain with nausea and vomiting EXAM: CT CHEST, ABDOMEN, AND PELVIS WITH CONTRAST TECHNIQUE: Multidetector CT imaging of the chest, abdomen and pelvis was performed following the standard protocol during bolus administration of intravenous contrast. RADIATION DOSE REDUCTION: This exam was performed according to the departmental dose-optimization program which includes automated exposure control, adjustment of the mA and/or kV according to patient size and/or use of iterative reconstruction technique. CONTRAST:  OMNIPAQUE IOHEXOL 300 MG/ML  SOLN COMPARISON:  CT  08/16/2018, CT chest 03/12/2012 FINDINGS: CT CHEST FINDINGS Cardiovascular: Nonaneurysmal aorta. Normal cardiac size. No pericardial effusion Mediastinum/Nodes: No enlarged mediastinal, hilar, or axillary lymph nodes. Thyroid gland, trachea, and esophagus demonstrate no significant findings. Lungs/Pleura: Mild mosaic density within the bilateral lungs potentially due to small airways disease. No acute consolidation, pleural effusion, or pneumothorax. Musculoskeletal: No acute osseous abnormality. CT ABDOMEN PELVIS FINDINGS Hepatobiliary: Hepatic steatosis. No calcified gallstone or biliary dilatation. Pancreas: Unremarkable. No pancreatic ductal dilatation or surrounding inflammatory changes. Spleen: Normal in size without focal abnormality. Adrenals/Urinary Tract: Adrenal glands are unremarkable. Kidneys are normal, without renal calculi, focal lesion, or hydronephrosis. Bladder is unremarkable. Stomach/Bowel: The stomach is nonenlarged. No dilated small bowel. Negative appendix. Mild fluid-filled nondistended small bowel in the pelvis with slight wall thickening and mucosal enhancement suggestive of enteritis. Vascular/Lymphatic: No significant vascular findings are present. No enlarged abdominal or pelvic lymph nodes. Reproductive: Uterus and bilateral adnexa are unremarkable. Other: Negative for free air.  Trace pelvic free fluid Musculoskeletal: No acute or significant osseous findings. IMPRESSION: 1. No CT evidence for acute intrathoracic abnormality. 2. Mild fluid-filled nondistended small bowel in the pelvis with minimal wall thickening and mucosal enhancement suggestive of enteritis of infectious or inflammatory etiology. 3. Trace free fluid in the pelvis Electronically Signed   By: Jasmine Pang M.D.   On: 05/03/2022 22:00    Procedures Procedures    Medications Ordered in ED Medications  iohexol (OMNIPAQUE) 300 MG/ML solution 100 mL (100 mLs Intravenous Contrast Given 05/03/22 2145)  ondansetron  (ZOFRAN) injection 4 mg (4 mg Intravenous Given 05/04/22 0339)  fentaNYL (SUBLIMAZE) injection 50 mcg (50 mcg Intravenous Given 05/04/22 0339)  sodium chloride 0.9 % bolus 1,000 mL (0 mLs Intravenous Stopped 05/04/22 0527)  ondansetron (ZOFRAN) injection 4 mg (4 mg Intravenous Given 05/04/22 0523)  ketorolac (TORADOL) 15 MG/ML injection 15 mg (15 mg Intravenous Given 05/04/22 0523)    ED Course/ Medical Decision Making/ A&P Clinical Course as of 05/04/22 0611  Tue May 04, 2022  0224 Ketones, ur(!): 20 Dehydration noted [DW]  0224 Glucose(!): 171 Mild hyperglycemia [DW]  0224 WBC(!): 14.6 Leukocytosis [DW]  0447 Attempted to use the [place] interpreter via language line, but none connected.  Son at bedside to interpret and pt also speaks some English  [DW]  0609 Extensive work-up has been overall unremarkable except for dehydration.  No convincing signs of UTI, will send urine culture.  Patient reports nausea improved and she is taking fluids.  However she continues to have diffuse body pain.  She reports pain throughout her back and is tender to palpation.  No recent falls or injury.  No focal weakness.  She is ambulatory.  She is able to move all extremities without difficulty.  She is overall well-appearing.  Suspect she may have had an underlying viral illness that triggered this episode of vomiting and diarrhea.  Patient will be discharged home.  Discussed need for close follow-up with PCP in 2 days if no improvement [DW]    Clinical Course User Index [DW] Zadie Rhine, MD                           Medical Decision Making Amount and/or Complexity of Data Reviewed Labs: ordered. Decision-making details documented in ED Course. Radiology: ordered.  Risk Prescription drug management.   This patient presents to the ED for concern of abdominal pain, this involves an extensive number of treatment options, and is a complaint that carries with it a high risk of complications and morbidity.   The differential diagnosis includes but is not limited to cholecystitis, cholelithiasis, pancreatitis, gastritis, peptic ulcer disease, appendicitis, bowel obstruction, bowel perforation, diverticulitis, AAA, ischemic bowel    Comorbidities that complicate the patient evaluation: Patient's presentation is complicated by their history of diabetes and obesity  Social Determinants of Health: Patient's  English as a second language   increases the complexity of managing their presentation  Additional history obtained: Additional history obtained from family  Lab Tests: I Ordered, and personally interpreted labs.  The pertinent results include: Leukocytosis  Imaging Studies ordered: I ordered imaging studies including CT scan abdomen pelvis   I independently visualized and interpreted imaging which showed  ?enteritis I agree with the radiologist interpretation  Cardiac Monitoring: The patient was maintained on a cardiac monitor.  I personally viewed and interpreted the cardiac monitor which showed an underlying rhythm of:  sinus rhythm  Medicines ordered and prescription drug management: I ordered medication including zofran and fentanyl  for pain, IV fluids  Reevaluation of the patient after these medicines showed that the patient    improved  Critical Interventions:  Fluids  Reevaluation: After the interventions noted above, I reevaluated the patient and found that they have :improved  Complexity of problems addressed: Patient's presentation is most consistent with  acute presentation with potential threat to life or bodily function  Disposition: After consideration of the diagnostic results and the patient's response to treatment,  I feel that the patent would benefit from discharge   .           Final Clinical Impression(s) / ED Diagnoses Final diagnoses:  Vomiting and diarrhea  Generalized abdominal pain  Muscle cramps    Rx / DC Orders ED Discharge Orders           Ordered    ondansetron (ZOFRAN-ODT) 4 MG disintegrating tablet  Every 8 hours PRN        05/04/22 0604              Zadie Rhine, MD 05/04/22 [phone]

## 2022-05-05 LAB — URINE CULTURE

## 2022-06-13 DIAGNOSIS — Z419 Encounter for procedure for purposes other than remedying health state, unspecified: Secondary | ICD-10-CM | POA: Diagnosis not present

## 2022-06-28 DIAGNOSIS — E538 Deficiency of other specified B group vitamins: Secondary | ICD-10-CM | POA: Diagnosis not present

## 2022-06-30 DIAGNOSIS — Z0000 Encounter for general adult medical examination without abnormal findings: Secondary | ICD-10-CM | POA: Diagnosis not present

## 2022-06-30 DIAGNOSIS — I10 Essential (primary) hypertension: Secondary | ICD-10-CM | POA: Diagnosis not present

## 2022-06-30 DIAGNOSIS — E1140 Type 2 diabetes mellitus with diabetic neuropathy, unspecified: Secondary | ICD-10-CM | POA: Diagnosis not present

## 2022-06-30 DIAGNOSIS — Z1239 Encounter for other screening for malignant neoplasm of breast: Secondary | ICD-10-CM | POA: Diagnosis not present

## 2022-06-30 DIAGNOSIS — K219 Gastro-esophageal reflux disease without esophagitis: Secondary | ICD-10-CM | POA: Diagnosis not present

## 2022-07-14 DIAGNOSIS — Z419 Encounter for procedure for purposes other than remedying health state, unspecified: Secondary | ICD-10-CM | POA: Diagnosis not present

## 2022-07-30 DIAGNOSIS — M5136 Other intervertebral disc degeneration, lumbar region: Secondary | ICD-10-CM | POA: Diagnosis not present

## 2022-07-30 DIAGNOSIS — J302 Other seasonal allergic rhinitis: Secondary | ICD-10-CM | POA: Diagnosis not present

## 2022-07-30 DIAGNOSIS — E1140 Type 2 diabetes mellitus with diabetic neuropathy, unspecified: Secondary | ICD-10-CM | POA: Diagnosis not present

## 2022-07-30 DIAGNOSIS — I10 Essential (primary) hypertension: Secondary | ICD-10-CM | POA: Diagnosis not present

## 2022-08-13 DIAGNOSIS — Z419 Encounter for procedure for purposes other than remedying health state, unspecified: Secondary | ICD-10-CM | POA: Diagnosis not present

## 2022-08-16 DIAGNOSIS — E538 Deficiency of other specified B group vitamins: Secondary | ICD-10-CM | POA: Diagnosis not present

## 2022-08-16 DIAGNOSIS — I10 Essential (primary) hypertension: Secondary | ICD-10-CM | POA: Diagnosis not present

## 2022-08-16 DIAGNOSIS — E1140 Type 2 diabetes mellitus with diabetic neuropathy, unspecified: Secondary | ICD-10-CM | POA: Diagnosis not present

## 2022-09-13 DIAGNOSIS — Z419 Encounter for procedure for purposes other than remedying health state, unspecified: Secondary | ICD-10-CM | POA: Diagnosis not present

## 2022-09-20 DIAGNOSIS — I10 Essential (primary) hypertension: Secondary | ICD-10-CM | POA: Diagnosis not present

## 2022-09-20 DIAGNOSIS — M5136 Other intervertebral disc degeneration, lumbar region: Secondary | ICD-10-CM | POA: Diagnosis not present

## 2022-09-20 DIAGNOSIS — E538 Deficiency of other specified B group vitamins: Secondary | ICD-10-CM | POA: Diagnosis not present

## 2022-09-20 DIAGNOSIS — K219 Gastro-esophageal reflux disease without esophagitis: Secondary | ICD-10-CM | POA: Diagnosis not present

## 2022-09-20 DIAGNOSIS — E1140 Type 2 diabetes mellitus with diabetic neuropathy, unspecified: Secondary | ICD-10-CM | POA: Diagnosis not present

## 2022-10-14 DIAGNOSIS — Z419 Encounter for procedure for purposes other than remedying health state, unspecified: Secondary | ICD-10-CM | POA: Diagnosis not present

## 2022-10-29 ENCOUNTER — Other Ambulatory Visit: Payer: Self-pay

## 2022-10-29 ENCOUNTER — Observation Stay (HOSPITAL_COMMUNITY)
Admission: EM | Admit: 2022-10-29 | Discharge: 2022-10-31 | Disposition: A | Discharge status: Home Health | Payer: Medicaid Other | Attending: Family Medicine | Admitting: Family Medicine

## 2022-10-29 ENCOUNTER — Emergency Department (HOSPITAL_COMMUNITY): Payer: Medicaid Other

## 2022-10-29 DIAGNOSIS — M545 Low back pain, unspecified: Secondary | ICD-10-CM | POA: Diagnosis not present

## 2022-10-29 DIAGNOSIS — W19XXXA Unspecified fall, initial encounter: Secondary | ICD-10-CM

## 2022-10-29 DIAGNOSIS — E1165 Type 2 diabetes mellitus with hyperglycemia: Secondary | ICD-10-CM | POA: Diagnosis not present

## 2022-10-29 DIAGNOSIS — S3992XA Unspecified injury of lower back, initial encounter: Principal | ICD-10-CM

## 2022-10-29 DIAGNOSIS — I10 Essential (primary) hypertension: Secondary | ICD-10-CM | POA: Diagnosis present

## 2022-10-29 DIAGNOSIS — Z0389 Encounter for observation for other suspected diseases and conditions ruled out: Secondary | ICD-10-CM | POA: Diagnosis not present

## 2022-10-29 DIAGNOSIS — M533 Sacrococcygeal disorders, not elsewhere classified: Secondary | ICD-10-CM | POA: Diagnosis not present

## 2022-10-29 DIAGNOSIS — M79604 Pain in right leg: Secondary | ICD-10-CM | POA: Diagnosis not present

## 2022-10-29 MED ORDER — OXYCODONE-ACETAMINOPHEN 5-325 MG PO TABS
1.0000 | ORAL_TABLET | Freq: Once | ORAL | Status: AC
  Administered 2022-10-29: 1 via ORAL
  Filled 2022-10-29: qty 1

## 2022-10-29 NOTE — ED Triage Notes (Signed)
Patient lost her balance and fell at home last week reports low back pain and right leg pain . Denies LOC.

## 2022-10-29 NOTE — ED Provider Triage Note (Signed)
Emergency Medicine Provider Triage Evaluation Note  Rachel Robertson , a 57 y.o. female  was evaluated in triage.  Pt complains of back pain.  States she fell about a week ago-- lost her footing and fell onto her buttocks.  No head injury or LOC.  States worsening pain in low back and tailbone area since then.  States when she tries to stand up, increased pain in right leg.  Denies focal numbness/weakness.  No incontinence.  Review of Systems  Positive: Back pain Negative: Fever, incontinence  Physical Exam  BP (!) 174/67   Pulse 80   Temp 97.9 F (36.6 C) (Oral)   Resp 18   LMP 02/16/2012   SpO2 98%  Gen:   Awake, no distress   Resp:  Normal effort  MSK:   Moves extremities without difficulty  Other:  Tenderness along lower lumbar spine and sacral area, no gross deformity  Medical Decision Making  Medically screening exam initiated at 10:21 PM.  Appropriate orders placed.  [name] was informed that the remainder of the evaluation will be completed by another provider, this initial triage assessment does not replace that evaluation, and the importance of remaining in the ED until their evaluation is complete.  Fall 1 week ago. No head injury or LOC.  Continued back pain and seems to have radicular component down RLE.  No focal deficits noted on triage.  X-rays ordered, percocet given for pain.   [name], PA-C 10/29/22 2223

## 2022-10-30 ENCOUNTER — Emergency Department (HOSPITAL_COMMUNITY): Payer: Medicaid Other

## 2022-10-30 ENCOUNTER — Encounter (HOSPITAL_COMMUNITY): Payer: Self-pay

## 2022-10-30 DIAGNOSIS — Z0389 Encounter for observation for other suspected diseases and conditions ruled out: Secondary | ICD-10-CM | POA: Diagnosis not present

## 2022-10-30 DIAGNOSIS — M545 Low back pain, unspecified: Secondary | ICD-10-CM | POA: Diagnosis not present

## 2022-10-30 DIAGNOSIS — S3992XA Unspecified injury of lower back, initial encounter: Secondary | ICD-10-CM

## 2022-10-30 DIAGNOSIS — M549 Dorsalgia, unspecified: Secondary | ICD-10-CM | POA: Diagnosis not present

## 2022-10-30 DIAGNOSIS — M79604 Pain in right leg: Secondary | ICD-10-CM | POA: Diagnosis not present

## 2022-10-30 DIAGNOSIS — W19XXXA Unspecified fall, initial encounter: Secondary | ICD-10-CM | POA: Diagnosis not present

## 2022-10-30 DIAGNOSIS — M533 Sacrococcygeal disorders, not elsewhere classified: Secondary | ICD-10-CM | POA: Diagnosis not present

## 2022-10-30 LAB — GLUCOSE, CAPILLARY
Glucose-Capillary: 376 mg/dL — ABNORMAL HIGH (ref 70–99)
Glucose-Capillary: 272 mg/dL — ABNORMAL HIGH (ref 70–99)
Glucose-Capillary: 297 mg/dL — ABNORMAL HIGH (ref 70–99)

## 2022-10-30 LAB — BASIC METABOLIC PANEL
Anion gap: 10 (ref 5–15)
BUN: 9 mg/dL (ref 6–20)
CO2: 23 mmol/L (ref 22–32)
Calcium: 8.5 mg/dL — ABNORMAL LOW (ref 8.9–10.3)
Chloride: 99 mmol/L (ref 98–111)
Creatinine, Ser: 0.83 mg/dL (ref 0.44–1.00)
GFR, Estimated: >60 mL/min (ref >60)
Glucose, Bld: 324 mg/dL — ABNORMAL HIGH (ref 70–99)
Potassium: 4.2 mmol/L (ref 3.5–5.1)
Sodium: 132 mmol/L — ABNORMAL LOW (ref 135–145)

## 2022-10-30 LAB — HEMOGLOBIN A1C
Hgb A1c MFr Bld: 11.1 % — ABNORMAL HIGH (ref 4.8–5.6)
Mean Plasma Glucose: 271.87 mg/dL

## 2022-10-30 LAB — HIV ANTIBODY (ROUTINE TESTING W REFLEX): HIV Screen 4th Generation wRfx: NONREACTIVE

## 2022-10-30 MED ORDER — ACETAMINOPHEN 325 MG PO TABS
650.0000 mg | ORAL_TABLET | Freq: Four times a day (QID) | ORAL | Status: DC
  Administered 2022-10-30 – 2022-10-31 (×6): 650 mg via ORAL
  Filled 2022-10-30 (×6): qty 2

## 2022-10-30 MED ORDER — ENOXAPARIN SODIUM 40 MG/0.4ML IJ SOSY
40.0000 mg | PREFILLED_SYRINGE | INTRAMUSCULAR | Status: DC
  Administered 2022-10-30 – 2022-10-31 (×2): 40 mg via SUBCUTANEOUS
  Filled 2022-10-30 (×2): qty 0.4

## 2022-10-30 MED ORDER — INSULIN ASPART 100 UNIT/ML IJ SOLN
0.0000 [IU] | Freq: Three times a day (TID) | INTRAMUSCULAR | Status: DC
  Administered 2022-10-30: 8 [IU] via SUBCUTANEOUS
  Administered 2022-10-31 (×2): 5 [IU] via SUBCUTANEOUS
  Administered 2022-10-31: 15 [IU] via SUBCUTANEOUS

## 2022-10-30 MED ORDER — MELATONIN 5 MG PO TABS
5.0000 mg | ORAL_TABLET | Freq: Every day | ORAL | Status: DC
  Administered 2022-10-30: 5 mg via ORAL
  Filled 2022-10-30: qty 1

## 2022-10-30 MED ORDER — SODIUM CHLORIDE 0.9 % IV SOLN
12.5000 mg | Freq: Once | INTRAVENOUS | Status: AC
  Administered 2022-10-30: 12.5 mg via INTRAVENOUS
  Filled 2022-10-30: qty 12.5
  Filled 2022-10-30: qty 0.5

## 2022-10-30 MED ORDER — OXYCODONE-ACETAMINOPHEN 5-325 MG PO TABS
1.0000 | ORAL_TABLET | Freq: Once | ORAL | Status: AC
  Administered 2022-10-30: 1 via ORAL
  Filled 2022-10-30: qty 1

## 2022-10-30 MED ORDER — MORPHINE SULFATE (PF) 4 MG/ML IV SOLN
4.0000 mg | Freq: Once | INTRAVENOUS | Status: AC
  Administered 2022-10-30: 4 mg via INTRAVENOUS
  Filled 2022-10-30: qty 1

## 2022-10-30 MED ORDER — KETOROLAC TROMETHAMINE 15 MG/ML IJ SOLN
15.0000 mg | Freq: Four times a day (QID) | INTRAMUSCULAR | Status: DC
  Administered 2022-10-30 – 2022-10-31 (×5): 15 mg via INTRAVENOUS
  Filled 2022-10-30 (×5): qty 1

## 2022-10-30 MED ORDER — INSULIN GLARGINE-YFGN 100 UNIT/ML ~~LOC~~ SOLN
10.0000 [IU] | Freq: Every day | SUBCUTANEOUS | Status: DC
  Administered 2022-10-30: 10 [IU] via SUBCUTANEOUS
  Filled 2022-10-30 (×2): qty 0.1

## 2022-10-30 MED ORDER — INSULIN ASPART 100 UNIT/ML IJ SOLN: 0.0000 [IU] | Freq: Three times a day (TID) | INTRAMUSCULAR | Status: DC

## 2022-10-30 MED ORDER — GABAPENTIN 300 MG PO CAPS
300.0000 mg | ORAL_CAPSULE | Freq: Three times a day (TID) | ORAL | Status: DC
  Administered 2022-10-30 – 2022-10-31 (×4): 300 mg via ORAL
  Filled 2022-10-30 (×4): qty 1

## 2022-10-30 MED ORDER — INSULIN GLARGINE-YFGN 100 UNIT/ML ~~LOC~~ SOLN
20.0000 [IU] | Freq: Every day | SUBCUTANEOUS | Status: DC
  Administered 2022-10-30 – 2022-10-31 (×2): 20 [IU] via SUBCUTANEOUS
  Filled 2022-10-30 (×3): qty 0.2

## 2022-10-30 MED ORDER — ONDANSETRON 4 MG PO TBDP
4.0000 mg | ORAL_TABLET | Freq: Once | ORAL | Status: AC
  Administered 2022-10-30: 4 mg via ORAL
  Filled 2022-10-30: qty 1

## 2022-10-30 MED ORDER — OXYCODONE HCL 5 MG PO TABS
5.0000 mg | ORAL_TABLET | ORAL | Status: DC | PRN
  Administered 2022-10-30 – 2022-10-31 (×2): 5 mg via ORAL
  Filled 2022-10-30 (×2): qty 1

## 2022-10-30 MED ORDER — LOSARTAN POTASSIUM 50 MG PO TABS
50.0000 mg | ORAL_TABLET | Freq: Every day | ORAL | Status: DC
  Administered 2022-10-30 – 2022-10-31 (×2): 50 mg via ORAL
  Filled 2022-10-30 (×2): qty 1

## 2022-10-30 MED ORDER — LIDOCAINE 5 % EX PTCH
1.0000 | MEDICATED_PATCH | CUTANEOUS | Status: DC
  Administered 2022-10-30 – 2022-10-31 (×2): 1 via TRANSDERMAL
  Filled 2022-10-30 (×2): qty 1

## 2022-10-30 MED ORDER — INSULIN ASPART 100 UNIT/ML IJ SOLN
0.0000 [IU] | Freq: Three times a day (TID) | INTRAMUSCULAR | Status: DC
  Administered 2022-10-30: 9 [IU] via SUBCUTANEOUS

## 2022-10-30 MED ORDER — MORPHINE SULFATE (PF) 2 MG/ML IV SOLN
2.0000 mg | Freq: Once | INTRAVENOUS | Status: AC
  Administered 2022-10-30: 2 mg via INTRAVENOUS
  Filled 2022-10-30: qty 1

## 2022-10-30 MED ORDER — PROMETHAZINE HCL 25 MG PO TABS: 12.5000 mg | ORAL_TABLET | Freq: Once | ORAL | Status: AC

## 2022-10-30 NOTE — Progress Notes (Signed)
PT Cancellation Note  Patient Details Name: Rachel Robertson MRN: [id number] DOB: [birth year]   Cancelled Treatment:    Reason Eval/Treat Not Completed: Patient not medically ready  Patient recently vomited and is requesting nausea medication before she can work with PT. RN aware and about to administer. PT will return   [name]  Office [phone]   Rexanne Mano 10/30/2022, 1:13 PM

## 2022-10-30 NOTE — Progress Notes (Signed)
FMTS Brief Progress Note  S: Patient states she is feeling much better, pain controlled.  Would like something to help her sleep but nothing strong, is okay with melatonin.   O: BP (!) 126/56 (BP Location: Left Arm)   Pulse 70   Temp 97.8 F (36.6 C) (Oral)   Resp 18   LMP 02/16/2012   SpO2 100%   Resting comfortably in bed, pleasant to speak with.  Well-perfused and breathing comfortably on room air.  A/P: Sacral pain Well-controlled with current regimen of Tylenol, gabapentin, Toradol, and oxycodone. -Continue plan per day team  Precious Gilding, DO 10/30/2022, 11:09 PM PGY-2, [facility] Night Resident  Please page [phone] with questions.

## 2022-10-30 NOTE — Hospital Course (Signed)
Rachel Robertson is a 57 y.o.female with a history of HTN, T2DM who was admitted to the [facility] Medicine Teaching Service at [facility] for sacral pain. Her hospital course is detailed below:  Sacral Pain Admitted for pain control in the setting of a fall on her tailbone.   Other chronic conditions were medically managed with home medications and formulary alternatives as necessary (***)  PCP Follow-up Recommendations:

## 2022-10-30 NOTE — Discharge Instructions (Addendum)
Dear Rachel Robertson,  It was a pleasure to take care of you during your stay at  [facility]  where you were treated for your Sacral pain.  While you were here, you were:  observed and cared for by our nurses and nursing assistants  provided therapy by our physical therapy and occupational therapy staff  treated with medicines / procedures by your doctors  provided resources by our social workers and case managers  Please review the medication list provided to you at discharge and stop, start taking, or continue taking the medications listed there.  You should also follow-up with your primary care doctor, or start seeing one if you don't have one yet. If applicable, here are some scheduled follow-ups for you:  Follow-up Information     Nicholes Stairs, MD.   Specialty: Orthopedic Surgery Contact information: [address] [place] [address] [phone]                  I recommend abstinence from alcohol, tobacco, and other illicit drug use.   If your symptoms recur, worsen, or if you have side effects to your medications, call your outpatient provider, 911, or go to the nearest emergency department.  Take care!  Camelia Phenes, MD [place] [facility] 10/31/2022 12:56 PM

## 2022-10-30 NOTE — Progress Notes (Signed)
Pt arrived to the unit form ED with son. Pt is able to ambulated from stretcher to bed. Pt alert and oriented x4, oriented pt and son to the room, place call button and room phone near patient.

## 2022-10-30 NOTE — H&P (Addendum)
Hospital Admission History and Physical Service Pager: [phone]  Patient name: Rachel Robertson Medical record number: [id number] Date of Birth: [birth year] Age: 57 y.o. Gender: female  Primary Care Provider: Nolene Ebbs, MD Consultants: None Code Status: FULL code Preferred Emergency Contact:  [phone]  SonAlene Mires [phone] Son-Omar [facility] Information     Name Relation Home Work [place] Son   [phone]   Johny Sax Spouse [phone]     Ama,Omar Pandora Leiter   [phone]   [facility] Daughter   [phone]        Chief Complaint: Sacral pain  Assessment and Plan: Rachel Robertson is a 57 y.o. female presenting with low back and sacral pain secondary to a fall. Differential for this patient includes trauma with associated muscle tenderness (most likely given mechanical fall), occult fracture (negative lumbar/sacral MRI) and cauda equina syndrome (denies bladder or bowel incontinence), pyelonephritis (no urinary of abdominal symptoms).  * Sacral pain Pain secondary to fall where she lost balance and had direct contact to sacrum and R leg. XR, CT and MRI of lumbar and sacrum negative for acute fracture. Denies bladder and bowel incontinence and saddle anesthesia. Possible dizziness prior to fall but unlikely secondary to syncope. Brief cardiac monitoring to assess for underlying arrythmias. -Admitted to FMTS service, Dr. McDiarmid attending -Pain management: Sch Tylenol 633m q6h, sch Toradol 158mq6h, Oxycodone 64m47m4h prn, Lidocaine patchs -Cardiac monitoring x 12 hours to assess for arrhythmia -PT/OT -Fall precuations  Essential hypertension Elevated [id number] upon admission, likely in the setting of acute pain.  -Restart home Losartan 21m70mype 2 diabetes mellitus with hyperglycemia (HCC) Elevated glucose to 300s upon admission. No signs or symptoms of DKA or HHS. No recent A1c for comparison. Reports compliance with  home diabetes meds.  -Cont home meds Lantus 20U AM and 10U PM -sSSI -F/u A1c    FEN/GI: Carb modified VTE Prophylaxis: Lovenox  Disposition: Med-surg  History of Present Illness:  Rachel Robertson 57 y34. female presenting with low back and tailbone pain that began 1 week ago after she fell. States she fell after she "lost her balance" and she fell like she was sitting down-onto her bottom and right leg. May have had some dizziness prior to the event. After falling down she was on the floor for a couple minutes and then crawled to a standing position due to pain. Has been ambulating with great difficulty over the last week and take Ibuprofen 800mg61mto 5 times per day. Report last night she started having excruciating pain not controlled by medication and decided to go to the ED. Denies any bowel or bladder incontinence and saddle anesthesia. Right leg difficult to lift due to pain. Denies any fevers. Eating and drinking normally. Denies h/o heart problems/arrhythmias and additional recent falls  In the ED, patient had XR, CT and MRI of low back and sacral region that did not reveal fractures. Pain was managed with Oxycodone but increased to IV Morphine due to severity. Unable to ambulate without significant pain in the ED. Admitted to hospital for pain management and PT.  Review Of Systems: Per HPI above  Pertinent Past Medical History: Diabetes HTN No hx of CVA or MI  Pertinent Past Surgical History: Shoulder surgery right   Remainder reviewed in history tab.   Pertinent Social History: Tobacco use: Never Alcohol use: Never Other Substance use: Never Normally walks on her own  Pertinent Family History: No  family hx of osteoporosis Remainder reviewed in history tab.   Important Outpatient Medications: Gabapentin 300 TID Glimipride 4 mg BID Zyrtec 72m daily Simvastatin 10 daily Januvia 100 daily Omeprazole 20 daily Tizanidine 4 mg daily Metoclopramide 5 TID   Ibuprofen 800 Losartan 50 daily Tresiba 20 in AM and 10 in PM Remainder reviewed in medication history.   Objective: BP (!) 159/51 (BP Location: Right Arm)   Pulse 72   Temp 97.8 F (36.6 C) (Oral)   Resp 18   LMP 02/16/2012   SpO2 100%  Exam: General: Well-appearing, NAD, alert Eyes: PERRLA. Anicertic sclera ENTM: Moist mucus membranes Neck: Supple, full ROM Cardiovascular: RRR without murmur Respiratory: CTAB. Normal WOB on RA Gastrointestinal: Soft, non-tender to deep palpation, non-distended.  MSK: Tender to palpation over lumbar spinous process and paraspinal musculature of low back and sacrum. Mild erythema but no wounds present. Mod CVA tenderness. No palpable bony step offs. Derm: Warm, dry. No rashes or wounds Neuro: Motor and sensation intact globally; LE strength 5/5, UE strength 5/5. No focal deficits. Psych: Cooperative. Pleasant  Labs:  CBC BMET  No results for input(s): "WBC", "HGB", "HCT", "PLT" in the last 168 hours. Recent Labs  Lab 10/30/22 0809  NA 132*  K 4.2  CL 99  CO2 23  BUN 9  CREATININE 0.83  GLUCOSE 324*  CALCIUM 8.5*    Pertinent additional labs: Glu 324   Imaging Studies Performed: MR LUMBAR SPINE WO CONTRAST Result Date: 10/30/2022 IMPRESSION: Unremarkable lumbar spine MRI examination.  CT PELVIS WO CONTRAST Result Date: 10/30/2022 IMPRESSION: Negative for fracture or malalignment.  CT Lumbar Spine Wo Contrast Result Date: 10/30/2022 IMPRESSION: Negative lumbar CT.  DG Lumbar Spine Complete Result Date: 10/29/2022 IMPRESSION: 1. Lucency in the inferior aspect of the sacrum on the lateral view which may be artifactual. The possibility of underlying fracture can not be completely excluded. Consider CT for further evaluation if clinically warranted. 2. Mild degenerative changes in the lower lumbar spine without evidence of acute fracture.  DG Sacrum/Coccyx Result Date: 10/29/2022 IMPRESSION: 1. Lucency in the inferior aspect of  the sacrum on the lateral view which may be artifactual. The possibility of underlying fracture can not be completely excluded. Consider CT for further evaluation if clinically warranted. 2. Mild degenerative changes in the lower lumbar spine without evidence of acute fracture.    HColletta Maryland MD 10/30/2022, 12:51 PM PGY-1, [place] pager: [phone] text pages welcome Secure chat group [facility]   Upper Level Addendum:  I have seen and evaluated this patient along with Dr. HJerilee Hohand reviewed the above note, making necessary revisions as appropriate.  I agree with the medical decision making and physical exam as noted above.  MGerrit Heck MD PGY-2 [facility]

## 2022-10-30 NOTE — ED Notes (Signed)
Pt noted ambulating to bathroom with assistance. Son present at pt's side, steady gait noted.

## 2022-10-30 NOTE — ED Notes (Signed)
Unable to ambulate without pain

## 2022-10-30 NOTE — ED Provider Notes (Signed)
57 year old female who is Somali speaking who is here with lower back pain from a fall a week prior.  She was seen evaluated for her symptoms by previous provider, and have advanced imaging including CT of the lumbar spine and pelvis which negative for acute fracture.  Patient was set for discharge home, however patient's nurse just informed me that she is unable to ambulate due to pain.  8:17 AM Despite receiving multiple dose of pain medication while in the ER, patient's pain is still moderate to severe and she is having difficulty ambulating despite trying.  Given this presentation, plan to consult medicine for admission.  Will request PT OT involvement.  Will obtain MRI of lumbar spine to rule out occult fracture.  I discussed this with patient and with family member who is in agreement with plan.  Will order basic labs including CBC BMP and UA.  -Labs ordered, independently viewed and interpreted by me.  Labs remarkable for CBG 324, pt will benefit from IVF and insulin as appropriate.  CBC is pending -The patient was maintained on a cardiac monitor.  I personally viewed and interpreted the cardiac monitored which showed an underlying rhythm of: NSR -Imaging independently viewed and interpreted by me and I agree with radiologist's interpretation.  Result remarkable for lumbar MRI without acute finding -This patient presents to the ED for concern of back injury, this involves an extensive number of treatment options, and is a complaint that carries with it a high risk of complications and morbidity.  The differential diagnosis includes strain, sprain, fx, dislocation, caudal equina -Co morbidities that complicate the patient evaluation includes DM, HTN -Treatment includes opiate medications -Reevaluation of the patient after these medicines showed that the patient improved -PCP office notes or outside notes reviewed -Discussion with specialist Family medicine resident who agrees with  admission -Escalation to admission/observation considered: patient is agreeable with admission  8:28 AM Appreciate consultation to family medicine resident who agrees to admit patient for pain control and follow-up on MRI of lumbar spine to assess for potential occult fracture.  At this time have low suspicion for cauda equina, malignancy, IV drug use causing discitis or any other acute emergent medical condition.  .Critical Care  Performed by: Domenic Moras, PA-C Authorized by: Domenic Moras, PA-C   Critical care provider statement:    Critical care time (minutes):  30   Critical care was time spent personally by me on the following activities:  Development of treatment plan with patient or surrogate, discussions with consultants, evaluation of patient's response to treatment, examination of patient, ordering and review of laboratory studies, ordering and review of radiographic studies, ordering and performing treatments and interventions, pulse oximetry, re-evaluation of patient's condition and review of old charts  BP (!) 171/69   Pulse 68   Temp 98.2 F (36.8 C) (Oral)   Resp 17   LMP 02/16/2012   SpO2 100%   Results for orders placed or performed during the hospital encounter of 0000000  Basic metabolic panel  Result Value Ref Range   Sodium 132 (L) 135 - 145 mmol/L   Potassium 4.2 3.5 - 5.1 mmol/L   Chloride 99 98 - 111 mmol/L   CO2 23 22 - 32 mmol/L   Glucose, Bld 324 (H) 70 - 99 mg/dL   BUN 9 6 - 20 mg/dL   Creatinine, Ser 0.83 0.44 - 1.00 mg/dL   Calcium 8.5 (L) 8.9 - 10.3 mg/dL   GFR, Estimated >60 >60 mL/min   Anion  gap 10 5 - 15   MR LUMBAR SPINE WO CONTRAST  Result Date: 10/30/2022 CLINICAL DATA:  Recent fall.  Back pain. EXAM: MRI LUMBAR SPINE WITHOUT CONTRAST TECHNIQUE: Multiplanar, multisequence MR imaging of the lumbar spine was performed. No intravenous contrast was administered. COMPARISON:  Prior MRI study from 2009.  Lumbar spine CT, same date FINDINGS:  Segmentation: There are five lumbar type vertebral bodies. The last full intervertebral disc space is labeled L5-S1. This correlates with the prior MRI and the CT scan. Alignment:  The vertebral bodies and facets are normally aligned. Vertebrae: No acute bony findings. No bone contusion, marrow edema or fracture. No pars defects. Small L4 hemangioma noted incidentally. Conus medullaris and cauda equina: Conus extends to the L1-2 level. Conus and cauda equina appear normal. Paraspinal and other soft tissues: No significant paraspinal or retroperitoneal findings. Disc levels: No lumbar disc protrusions, spinal or foraminal stenosis in the lumbar spine. IMPRESSION: Unremarkable lumbar spine MRI examination. Electronically Signed   By: Marijo Sanes M.D.   On: 10/30/2022 09:34   CT PELVIS WO CONTRAST  Result Date: 10/30/2022 CLINICAL DATA:  Hip fracture suspected with x-ray completed EXAM: CT PELVIS WITHOUT CONTRAST TECHNIQUE: Multidetector CT imaging of the pelvis was performed following the standard protocol without intravenous contrast. RADIATION DOSE REDUCTION: This exam was performed according to the departmental dose-optimization program which includes automated exposure control, adjustment of the mA and/or kV according to patient size and/or use of iterative reconstruction technique. COMPARISON:  Pelvis radiograph from yesterday FINDINGS: Negative for hip fracture or subluxation. Negative for pelvic ring fracture or diastasis. No acute soft tissue injury. IMPRESSION: Negative for fracture or malalignment. Electronically Signed   By: Jorje Guild M.D.   On: 10/30/2022 06:22   CT Lumbar Spine Wo Contrast  Result Date: 10/30/2022 CLINICAL DATA:  Compression fracture, lumbar. Low back pain. Right leg pain EXAM: CT LUMBAR SPINE WITHOUT CONTRAST TECHNIQUE: Multidetector CT imaging of the lumbar spine was performed without intravenous contrast administration. Multiplanar CT image reconstructions were also  generated. RADIATION DOSE REDUCTION: This exam was performed according to the departmental dose-optimization program which includes automated exposure control, adjustment of the mA and/or kV according to patient size and/or use of iterative reconstruction technique. COMPARISON:  None Available. FINDINGS: Segmentation: 5 lumbar type vertebrae. Alignment: Normal. Vertebrae: No acute fracture or focal pathologic process. Paraspinal and other soft tissues: Negative. Disc levels: No visible herniation or impingement. IMPRESSION: Negative lumbar CT. Electronically Signed   By: Jorje Guild M.D.   On: 10/30/2022 06:05   DG Lumbar Spine Complete  Result Date: 10/29/2022 CLINICAL DATA:  Fall at home with severe lower back pain. EXAM: LUMBAR SPINE - COMPLETE 4+ VIEW; SACRUM AND COCCYX - 2+ VIEW COMPARISON:  05/03/2022. FINDINGS: There is no evidence of acute lumbar spine fracture. Alignment is normal. Intervertebral disc spaces are maintained. Mild facet arthropathy is noted in the lower lumbar spine. Lucency is present in the mid to lower aspect of the sacrum on the lateral view which may be artifactual. The remaining bony structures appear intact and there is no dislocation at the hips. Scattered phleboliths are present in the pelvis. Vascular calcifications are noted in the lower extremities bilaterally. A stable calcification is noted in the pelvis on the left. IMPRESSION: 1. Lucency in the inferior aspect of the sacrum on the lateral view which may be artifactual. The possibility of underlying fracture can not be completely excluded. Consider CT for further evaluation if clinically warranted. 2. Mild degenerative  changes in the lower lumbar spine without evidence of acute fracture. Electronically Signed   By: Brett Fairy M.D.   On: 10/29/2022 23:27   DG Sacrum/Coccyx  Result Date: 10/29/2022 CLINICAL DATA:  Fall at home with severe lower back pain. EXAM: LUMBAR SPINE - COMPLETE 4+ VIEW; SACRUM AND COCCYX -  2+ VIEW COMPARISON:  05/03/2022. FINDINGS: There is no evidence of acute lumbar spine fracture. Alignment is normal. Intervertebral disc spaces are maintained. Mild facet arthropathy is noted in the lower lumbar spine. Lucency is present in the mid to lower aspect of the sacrum on the lateral view which may be artifactual. The remaining bony structures appear intact and there is no dislocation at the hips. Scattered phleboliths are present in the pelvis. Vascular calcifications are noted in the lower extremities bilaterally. A stable calcification is noted in the pelvis on the left. IMPRESSION: 1. Lucency in the inferior aspect of the sacrum on the lateral view which may be artifactual. The possibility of underlying fracture can not be completely excluded. Consider CT for further evaluation if clinically warranted. 2. Mild degenerative changes in the lower lumbar spine without evidence of acute fracture. Electronically Signed   By: Brett Fairy M.D.   On: 10/29/2022 23:27       Domenic Moras, PA-C 10/30/22 [place], MD 11/03/22 [phone]

## 2022-10-30 NOTE — ED Notes (Signed)
ED TO INPATIENT HANDOFF REPORT  ED Nurse Name and Phone #: Korinne Greenstein,rn [phone]  S Name/Age/Gender Rachel Robertson 57 y.o. female Room/Bed: 046C/046C  Code Status   Code Status: Not on file  Home/SNF/Other Home Patient oriented to: self, place, time, and situation Is this baseline? Yes   Triage Complete: Triage complete  Chief Complaint Sacral pain [M53.3]  Triage Note Patient lost her balance and fell at home last week reports low back pain and right leg pain . Denies LOC.    Allergies Allergies  Allergen Reactions   Metformin     REACTION: GI intolerance    Level of Care/Admitting Diagnosis ED Disposition     ED Disposition  Admit   Condition  --   Comment  Hospital Area: [facility] [[id number]]  Level of Care: Med-Surg [16]  May place patient in observation at [facility] or [place] if equivalent level of care is available:: No  Covid Evaluation: Asymptomatic - no recent exposure (last 10 days) testing not required  Diagnosis: Sacral pain [id number]  Admitting Physician: Rickey Primus  Attending Physician: Lissa Morales D [[id number]]          B Medical/Surgery History Past Medical History:  Diagnosis Date   Back pain    Carotid stenosis 06/22/2015   1-39% bilateral carotid artery stenosis 06/2015.   Diabetes mellitus    Essential hypertension 06/15/2015   GERD (gastroesophageal reflux disease)    Hypertension    Past Surgical History:  Procedure Laterality Date   SHOULDER SURGERY       A IV Location/Drains/Wounds Patient Lines/Drains/Airways Status     Active Line/Drains/Airways     Name Placement date Placement time Site Days   Peripheral IV 10/30/22 22 G Distal;Left;Posterior Forearm 10/30/22  0834  Forearm  less than 1            Intake/Output Last 24 hours No intake or output data in the 24 hours ending 10/30/22 1018  Labs/Imaging Results for orders placed or performed during the hospital encounter of  10/29/22 (from the past 48 hour(s))  Basic metabolic panel     Status: Abnormal   Collection Time: 10/30/22  8:09 AM  Result Value Ref Range   Sodium 132 (L) 135 - 145 mmol/L   Potassium 4.2 3.5 - 5.1 mmol/L   Chloride 99 98 - 111 mmol/L   CO2 23 22 - 32 mmol/L   Glucose, Bld 324 (H) 70 - 99 mg/dL    Comment: Glucose reference range applies only to samples taken after fasting for at least 8 hours.   BUN 9 6 - 20 mg/dL   Creatinine, Ser 0.83 0.44 - 1.00 mg/dL   Calcium 8.5 (L) 8.9 - 10.3 mg/dL   GFR, Estimated >60 >60 mL/min    Comment: (NOTE) Calculated using the CKD-EPI Creatinine Equation (2021)    Anion gap 10 5 - 15    Comment: Performed at [facility] [address], [place],  [address]   MR LUMBAR SPINE WO CONTRAST  Result Date: 10/30/2022 CLINICAL DATA:  Recent fall.  Back pain. EXAM: MRI LUMBAR SPINE WITHOUT CONTRAST TECHNIQUE: Multiplanar, multisequence MR imaging of the lumbar spine was performed. No intravenous contrast was administered. COMPARISON:  Prior MRI study from 2009.  Lumbar spine CT, same date FINDINGS: Segmentation: There are five lumbar type vertebral bodies. The last full intervertebral disc space is labeled L5-S1. This correlates with the prior MRI and the CT scan. Alignment:  The vertebral bodies and facets are normally aligned. Vertebrae: No acute bony findings. No bone contusion, marrow edema or fracture. No pars defects. Small L4 hemangioma noted incidentally. Conus medullaris and cauda equina: Conus extends to the L1-2 level. Conus and cauda equina appear normal. Paraspinal and other soft tissues: No significant paraspinal or retroperitoneal findings. Disc levels: No lumbar disc protrusions, spinal or foraminal stenosis in the lumbar spine. IMPRESSION: Unremarkable lumbar spine MRI examination. Electronically Signed   By: Marijo Sanes M.D.   On: 10/30/2022 09:34   CT PELVIS WO CONTRAST  Result Date: 10/30/2022 CLINICAL DATA:  Hip fracture  suspected with x-ray completed EXAM: CT PELVIS WITHOUT CONTRAST TECHNIQUE: Multidetector CT imaging of the pelvis was performed following the standard protocol without intravenous contrast. RADIATION DOSE REDUCTION: This exam was performed according to the departmental dose-optimization program which includes automated exposure control, adjustment of the mA and/or kV according to patient size and/or use of iterative reconstruction technique. COMPARISON:  Pelvis radiograph from yesterday FINDINGS: Negative for hip fracture or subluxation. Negative for pelvic ring fracture or diastasis. No acute soft tissue injury. IMPRESSION: Negative for fracture or malalignment. Electronically Signed   By: Jorje Guild M.D.   On: 10/30/2022 06:22   CT Lumbar Spine Wo Contrast  Result Date: 10/30/2022 CLINICAL DATA:  Compression fracture, lumbar. Low back pain. Right leg pain EXAM: CT LUMBAR SPINE WITHOUT CONTRAST TECHNIQUE: Multidetector CT imaging of the lumbar spine was performed without intravenous contrast administration. Multiplanar CT image reconstructions were also generated. RADIATION DOSE REDUCTION: This exam was performed according to the departmental dose-optimization program which includes automated exposure control, adjustment of the mA and/or kV according to patient size and/or use of iterative reconstruction technique. COMPARISON:  None Available. FINDINGS: Segmentation: 5 lumbar type vertebrae. Alignment: Normal. Vertebrae: No acute fracture or focal pathologic process. Paraspinal and other soft tissues: Negative. Disc levels: No visible herniation or impingement. IMPRESSION: Negative lumbar CT. Electronically Signed   By: Jorje Guild M.D.   On: 10/30/2022 06:05   DG Lumbar Spine Complete  Result Date: 10/29/2022 CLINICAL DATA:  Fall at home with severe lower back pain. EXAM: LUMBAR SPINE - COMPLETE 4+ VIEW; SACRUM AND COCCYX - 2+ VIEW COMPARISON:  05/03/2022. FINDINGS: There is no evidence of acute  lumbar spine fracture. Alignment is normal. Intervertebral disc spaces are maintained. Mild facet arthropathy is noted in the lower lumbar spine. Lucency is present in the mid to lower aspect of the sacrum on the lateral view which may be artifactual. The remaining bony structures appear intact and there is no dislocation at the hips. Scattered phleboliths are present in the pelvis. Vascular calcifications are noted in the lower extremities bilaterally. A stable calcification is noted in the pelvis on the left. IMPRESSION: 1. Lucency in the inferior aspect of the sacrum on the lateral view which may be artifactual. The possibility of underlying fracture can not be completely excluded. Consider CT for further evaluation if clinically warranted. 2. Mild degenerative changes in the lower lumbar spine without evidence of acute fracture. Electronically Signed   By: Brett Fairy M.D.   On: 10/29/2022 23:27   DG Sacrum/Coccyx  Result Date: 10/29/2022 CLINICAL DATA:  Fall at home with severe lower back pain. EXAM: LUMBAR SPINE - COMPLETE 4+ VIEW; SACRUM AND COCCYX - 2+ VIEW COMPARISON:  05/03/2022. FINDINGS: There is no evidence of acute lumbar spine fracture. Alignment is normal. Intervertebral disc spaces are maintained. Mild facet arthropathy is noted in the lower lumbar spine. Lucency  is present in the mid to lower aspect of the sacrum on the lateral view which may be artifactual. The remaining bony structures appear intact and there is no dislocation at the hips. Scattered phleboliths are present in the pelvis. Vascular calcifications are noted in the lower extremities bilaterally. A stable calcification is noted in the pelvis on the left. IMPRESSION: 1. Lucency in the inferior aspect of the sacrum on the lateral view which may be artifactual. The possibility of underlying fracture can not be completely excluded. Consider CT for further evaluation if clinically warranted. 2. Mild degenerative changes in the lower  lumbar spine without evidence of acute fracture. Electronically Signed   By: Brett Fairy M.D.   On: 10/29/2022 23:27    Pending Labs Unresulted Labs (From admission, onward)     Start     Ordered   10/30/22 1000  CBC with Differential/Platelet  Once,   STAT        10/30/22 1000   10/30/22 0809  CBC with Differential  Once,   STAT        10/30/22 0808   10/30/22 0809  Urinalysis, Routine w reflex microscopic -Urine, Clean Catch  Once,   URGENT       Question:  Specimen Source  Answer:  Urine, Clean Catch   10/30/22 0808            Vitals/Pain Today's Vitals   10/30/22 0525 10/30/22 0700 10/30/22 0724 10/30/22 0810  BP:  (!) 171/69    Pulse:  68    Resp:  17    Temp:      TempSrc:      SpO2:  100%    PainSc: 8   8  9     $ Isolation Precautions No active isolations  Medications Medications  acetaminophen (TYLENOL) tablet 650 mg (650 mg Oral Given 10/30/22 1018)  oxyCODONE (Oxy IR/ROXICODONE) immediate release tablet 5 mg (5 mg Oral Given 10/30/22 1018)  oxyCODONE-acetaminophen (PERCOCET/ROXICET) 5-325 MG per tablet 1 tablet (1 tablet Oral Given 10/29/22 2225)  oxyCODONE-acetaminophen (PERCOCET/ROXICET) 5-325 MG per tablet 1 tablet (1 tablet Oral Given 10/30/22 0436)  oxyCODONE-acetaminophen (PERCOCET/ROXICET) 5-325 MG per tablet 1 tablet (1 tablet Oral Given 10/30/22 0727)  morphine (PF) 4 MG/ML injection 4 mg (4 mg Intravenous Given 10/30/22 0836)    Mobility walks     Focused Assessments musculoskeletal   R Recommendations: See Admitting Provider Note  Report given to:   Additional Notes: fall risk, son interpret for pt(mother)

## 2022-10-30 NOTE — Assessment & Plan Note (Addendum)
Pain secondary to fall where she lost balance and had direct contact to sacrum and R leg. XR, CT and MRI of lumbar and sacrum negative for acute fracture. Denies bladder and bowel incontinence and saddle anesthesia. Possible dizziness prior to fall but unlikely secondary to syncope. Brief cardiac monitoring to assess for underlying arrythmias. -Admitted to FMTS service, Dr. McDiarmid attending -Pain management: Sch Tylenol 67m q6h, sch Toradol 154mq6h, Oxycodone 65m1m4h prn, Lidocaine patchs, home gabapentin 300 mg TID -Cardiac monitoring x 12 hours to assess for arrhythmia -PT/OT -Fall precuations

## 2022-10-30 NOTE — Assessment & Plan Note (Addendum)
Normotensive this AM -continue home Losartan 42m

## 2022-10-30 NOTE — ED Provider Notes (Signed)
[facility] Emergency Department Provider Note MRN:  [id number]  Arrival date & time: 10/30/22     Chief Complaint   Fall / Back pain    History of Present Illness   Rachel Robertson is a 57 y.o. year-old female presents to the ED with chief complaint of low back pain and tailbone pain.  Patient states that she fell and landed on her bottom last week.  She reports some for persistent pain.  She denies any numbness or tingling in her lower extremities.  Denies any other problems.  No reported bowel or bladder incontinence.Marland Kitchen  History provided by patient.   Review of Systems  Pertinent positive and negative review of systems noted in HPI.    Physical Exam   Vitals:   10/30/22 0509 10/30/22 0510  BP: (!) 148/66   Pulse: 64   Resp: 19   Temp:  98.2 F (36.8 C)  SpO2: 100%     CONSTITUTIONAL:  well-appearing, NAD NEURO:  Alert and oriented x 3, CN 3-12 grossly intact EYES:  eyes equal and reactive ENT/NECK:  Supple, no stridor  CARDIO:  normal rate, regular rhythm, appears well-perfused  PULM:  No respiratory distress,  GI/GU:  non-distended,  MSK/SPINE:  No gross deformities, no edema, moves all extremities  SKIN:  no rash, atraumatic   *Additional and/or pertinent findings included in MDM below  Diagnostic and Interventional Summary    EKG Interpretation  Date/Time:    Ventricular Rate:    PR Interval:    QRS Duration:   QT Interval:    QTC Calculation:   R Axis:     Text Interpretation:         Labs Reviewed - No data to display  CT Lumbar Spine Wo Contrast  Final Result    CT PELVIS WO CONTRAST  Final Result    DG Lumbar Spine Complete  Final Result    DG Sacrum/Coccyx  Final Result      Medications  oxyCODONE-acetaminophen (PERCOCET/ROXICET) 5-325 MG per tablet 1 tablet (1 tablet Oral Given 10/29/22 2225)  oxyCODONE-acetaminophen (PERCOCET/ROXICET) 5-325 MG per tablet 1 tablet (1 tablet Oral Given 10/30/22 0436)      Procedures  /  Critical Care Procedures  ED Course and Medical Decision Making  I have reviewed the triage vital signs, the nursing notes, and pertinent available records from the EMR.  Social Determinants Affecting Complexity of Care: Patient has no clinically significant social determinants affecting this chief complaint..   ED Course:    Medical Decision Making Patient here after having a fall last week.  Complains of low back and buttock pain.  She has some tenderness to palpation.  X-ray performed in triage concerning for possible sacral fracture.  Will check CT.  CT negative for fracture.  CT of the pelvis also negative for fracture.  Will have patient follow-up with ortho.    Amount and/or Complexity of Data Reviewed Radiology: ordered.  Risk Prescription drug management.     Consultants: No consultations were needed in caring for this patient.   Treatment and Plan: Emergency department workup does not suggest an emergent condition requiring admission or immediate intervention beyond  what has been performed at this time. The patient is safe for discharge and has  been instructed to return immediately for worsening symptoms, change in  symptoms or any other concerns    Final Clinical Impressions(s) / ED Diagnoses     ICD-10-CM   1. Fall, initial encounter  W19.Merril Abbe  2. Injury of coccyx, initial encounter  S39.92XA       ED Discharge Orders     None         Discharge Instructions Discussed with and Provided to Patient:   Discharge Instructions   None      Montine Circle, Hershal Coria 10/30/22 [id number]    Ezequiel Essex, MD 10/30/22 1925

## 2022-10-30 NOTE — Assessment & Plan Note (Addendum)
Glucose in 200s -Cont home meds Lantus 20U AM and 10U PM -sSSI - HbA1c 11.1

## 2022-10-30 NOTE — ED Notes (Signed)
Attempted to ambulate patient and pt complained of lower back pain as well as weakness in her legs. Assisted pt back to bed and given new blanket.

## 2022-10-30 NOTE — ED Notes (Signed)
Pt. To MRI

## 2022-10-30 NOTE — Evaluation (Signed)
Physical Therapy Evaluation and Discharge Patient Details Name: Rachel Robertson MRN: [id number] DOB: [birth year] Today's Date: 10/30/2022  History of Present Illness  57 y.o. year-old female presents to the ED 10/29/22 with chief complaint of low back pain and tailbone pain.  Patient states that she fell and landed on her bottom last week. Xray, CT and MRI of lumbar spine and pelvis negative.  Clinical Impression   Patient evaluated by Physical Therapy with son present and interpreting as needed (pt does speak some [place]). Patient able to walk with RW x 35 feet with min assist. Son present for all education. She will benefit from a RW, BSC, and HHPT if possible. All acute education has been completed and the patient has no further questions.  PT is signing off. Thank you for this referral.        Recommendations for follow up therapy are one component of a multi-disciplinary discharge planning process, led by the attending physician.  Recommendations may be updated based on patient status, additional functional criteria and insurance authorization.  Follow Up Recommendations Home health PT (family only wants if covered by insurance)      Assistance Recommended at Discharge Frequent or constant Supervision/Assistance  Patient can return home with the following  A little help with walking and/or transfers;A little help with bathing/dressing/bathroom;Assistance with cooking/housework;Assist for transportation;Help with stairs or ramp for entrance    Equipment Recommendations Rolling walker (2 wheels);BSC/3in1  Recommendations for Other Services       Functional Status Assessment Patient has had a recent decline in their functional status and demonstrates the ability to make significant improvements in function in a reasonable and predictable amount of time.     Precautions / Restrictions Precautions Precautions: Fall Precaution Comments: son reports she slippedand was lightheaded when she  fell Restrictions Weight Bearing Restrictions: No      Mobility  Bed Mobility Overal bed mobility: Modified Independent                  Transfers Overall transfer level: Needs assistance Equipment used: Rolling walker (2 wheels) Transfers: Sit to/from Stand Sit to Stand: Min guard           General transfer comment: vc for correct use of RW    Ambulation/Gait Ambulation/Gait assistance: Min assist Gait Distance (Feet): 35 Feet Assistive device: Rolling walker (2 wheels) Gait Pattern/deviations: Step-through pattern, Decreased stride length, Decreased weight shift to right, Antalgic Gait velocity: very slow Gait velocity interpretation: <1.8 ft/sec, indicate of risk for recurrent falls   General Gait Details: vc to not get so close to RW and to advance RW prior to stepping (pt with continued need for assist maneuvering RW and son educated)  Financial trader [employer] (Stroke Patients Only)       Balance Overall balance assessment: Mild deficits observed, not formally tested                                           Pertinent Vitals/Pain Pain Assessment Pain Assessment: Faces Faces Pain Scale: Hurts even more Pain Location: low back Pain Descriptors / Indicators: Discomfort, Grimacing Pain Intervention(s): Limited activity within patient's tolerance, Monitored during session, Premedicated before session    Home Living Family/patient expects to be discharged to:: Private residence Living Arrangements: Spouse/significant other;Children Available Help  at Discharge: Family;Available 24 hours/day Type of Home: House Home Access: Level entry     Alternate Level Stairs-Number of Steps: 13 Home Layout: Two level;Able to live on main level with bedroom/bathroom Home Equipment: None      Prior Function Prior Level of Function : Independent/Modified Independent             Mobility Comments:  denies use of assistive device prior to fall; denies balance problems prior to slip/fall ADLs Comments: independent     Hand Dominance        Extremity/Trunk Assessment   Upper Extremity Assessment Upper Extremity Assessment: Overall WFL for tasks assessed    Lower Extremity Assessment Lower Extremity Assessment: RLE deficits/detail;LLE deficits/detail RLE Deficits / Details: able to sit to stand and bear weight RLE: Unable to fully assess due to pain LLE Deficits / Details: able to sit to stand and bear weight LLE: Unable to fully assess due to pain    Cervical / Trunk Assessment Cervical / Trunk Assessment: Normal  Communication   Communication: Prefers language other than English (speaks some [place] and son interprets as needed)  Cognition Arousal/Alertness: Awake/alert Behavior During Therapy: WFL for tasks assessed/performed Overall Cognitive Status: Within Functional Limits for tasks assessed                                          General Comments      Exercises     Assessment/Plan    PT Assessment All further PT needs can be met in the next venue of care  PT Problem List Decreased activity tolerance;Decreased balance;Decreased mobility;Decreased knowledge of use of DME;Pain       PT Treatment Interventions      PT Goals (Current goals can be found in the Care Plan section)  Acute Rehab PT Goals Patient Stated Goal: relieve pain PT Goal Formulation: All assessment and education complete, DC therapy    Frequency       Co-evaluation               AM-PAC PT "6 Clicks" Mobility  Outcome Measure Help needed turning from your back to your side while in a flat bed without using bedrails?: None Help needed moving from lying on your back to sitting on the side of a flat bed without using bedrails?: None Help needed moving to and from a bed to a chair (including a wheelchair)?: A Little Help needed standing up from a chair using  your arms (e.g., wheelchair or bedside chair)?: A Little Help needed to walk in hospital room?: A Little Help needed climbing 3-5 steps with a railing? : A Lot 6 Click Score: 19    End of Session Equipment Utilized During Treatment: Gait belt Activity Tolerance: Patient limited by pain Patient left: in bed;with call bell/phone within reach;with family/visitor present;with nursing/sitter in room Nurse Communication: Mobility status;Other (comment) (PT to contact TOC for dc needs) PT Visit Diagnosis: Unsteadiness on feet (R26.81);Difficulty in walking, not elsewhere classified (R26.2);Pain Pain - Right/Left:  (bil) Pain - part of body:  (low back)    Time: [id number] PT Time Calculation (min) (ACUTE ONLY): 19 min   Charges:   PT Evaluation $PT Eval Low Complexity: [place], PT Acute Rehabilitation Services  Office [phone]   Rexanne Mano 10/30/2022, 2:18 PM

## 2022-10-31 LAB — GLUCOSE, CAPILLARY
Glucose-Capillary: 238 mg/dL — ABNORMAL HIGH (ref 70–99)
Glucose-Capillary: 373 mg/dL — ABNORMAL HIGH (ref 70–99)
Glucose-Capillary: 247 mg/dL — ABNORMAL HIGH (ref 70–99)

## 2022-10-31 LAB — BASIC METABOLIC PANEL
Anion gap: 8 (ref 5–15)
BUN: 13 mg/dL (ref 6–20)
CO2: 27 mmol/L (ref 22–32)
Calcium: 8.6 mg/dL — ABNORMAL LOW (ref 8.9–10.3)
Chloride: 98 mmol/L (ref 98–111)
Creatinine, Ser: 1.12 mg/dL — ABNORMAL HIGH (ref 0.44–1.00)
GFR, Estimated: 57 mL/min — ABNORMAL LOW (ref >60)
Glucose, Bld: 216 mg/dL — ABNORMAL HIGH (ref 70–99)
Potassium: 3.6 mmol/L (ref 3.5–5.1)
Sodium: 133 mmol/L — ABNORMAL LOW (ref 135–145)

## 2022-10-31 MED ORDER — LIDOCAINE 5 % EX PTCH: 1.0000 | MEDICATED_PATCH | CUTANEOUS | 0 refills | Status: AC

## 2022-10-31 MED ORDER — ACETAMINOPHEN 325 MG PO TABS: 650.0000 mg | ORAL_TABLET | Freq: Four times a day (QID) | ORAL | Status: AC

## 2022-10-31 MED ORDER — OXYCODONE HCL 5 MG PO TABS: 5.0000 mg | ORAL_TABLET | Freq: Three times a day (TID) | ORAL | 0 refills | Status: AC | PRN

## 2022-10-31 MED ORDER — GABAPENTIN 300 MG PO CAPS: 300.0000 mg | ORAL_CAPSULE | Freq: Three times a day (TID) | ORAL | 0 refills | Status: AC

## 2022-10-31 NOTE — TOC Transition Note (Signed)
Transition of Care [facility]) - CM/SW Discharge Note   Patient Details  Name: Rachel Robertson MRN: [id number] Date of Birth: [birth year]  Transition of Care [facility]) CM/SW Contact:  Zenon Mayo, RN Phone Number: 10/31/2022, 5:01 PM   Clinical Narrative:    Patient is for dc today, offered choice, per son they have no preference, NCM made referral to [facility] with [employer] for [facility] and rolling walker and Cory with [facility] for [place].  Tommi Rumps is able to take referral.  Soc will begin 24 to 48 hrs post dc.     Final next level of care: Home w Home Health Services Barriers to Discharge: No Barriers Identified   Patient Goals and CMS Choice CMS Medicare.gov Compare Post Acute Care list provided to:: Patient Represenative (must comment) Choice offered to / list presented to : Adult Children  Discharge Placement                         Discharge Plan and Services Additional resources added to the After Visit Summary for                  DME Arranged: Walker rolling, Bedside commode DME Agency: [facility] Date DME Agency Contacted: 10/31/22 Time DME Agency Contacted: 1700 Representative spoke with at DME Agency: Brenton Grills HH Arranged: PT [place]: [place] Date [place]: 10/31/22 Time [place]: [place] Representative spoke with at [place]: [place] Interventions SDOH Screenings   Tobacco Use: Low Risk  (10/30/2022)     Readmission Risk Interventions     No data to display

## 2022-10-31 NOTE — Evaluation (Addendum)
Occupational Therapy Evaluation Patient Details Name: Rachel Robertson MRN: [id number] DOB: April 12, 1966 Today's Date: 10/31/2022   History of Present Illness 57 y.o. year-old female presents to the ED 10/29/22 with chief complaint of low back pain and tailbone pain.  Patient states that she fell and landed on her bottom last week. Xray, CT and MRI of lumbar spine and pelvis negative.   Clinical Impression   PTA, pt lived with her family and was independent in ADL and IADL; did not drive. Upon eval, pt complaining of midback pain, tingling and numbness in bil hands, and BUE (RUE during session) tremor when fatigued. Pt presents with decreased strength, activity tolerance, safety, numbness/tingling, and pain. Performing UB ADL with set-up to min A and LB Adl with mod A. Pt with RUE tremor present when performing ROM, and weakness as compared to L. RN notified as pt has not had any cervical imaging during current admission. Educated pt and son regaridng mobility recommendations and compensatory techniques for LB ADL for comfort. Will continue to follow acutely to optimize safety and independence in ALD and IADL.      Recommendations for follow up therapy are one component of a multi-disciplinary discharge planning process, led by the attending physician.  Recommendations may be updated based on patient status, additional functional criteria and insurance authorization.   Follow Up Recommendations  Home health OT     Assistance Recommended at Discharge Intermittent Supervision/Assistance  Patient can return home with the following A little help with walking and/or transfers;A little help with bathing/dressing/bathroom;Assistance with cooking/housework;Assist for transportation;Help with stairs or ramp for entrance    Functional Status Assessment  Patient has had a recent decline in their functional status and demonstrates the ability to make significant improvements in function in a reasonable and  predictable amount of time.  Equipment Recommendations  BSC/3in1    Recommendations for Other Services       Precautions / Restrictions Precautions Precautions: Fall Precaution Comments: son reports she slipped and was lightheaded when she fell Restrictions Weight Bearing Restrictions: No      Mobility Bed Mobility Overal bed mobility: Needs Assistance Bed Mobility: Supine to Sit     Supine to sit: Min assist     General bed mobility comments: for trunk elevation    Transfers Overall transfer level: Needs assistance Equipment used: Rolling walker (2 wheels) Transfers: Sit to/from Stand Sit to Stand: Min guard           General transfer comment: vc for correct use of RW      Balance Overall balance assessment: Mild deficits observed, not formally tested                                         ADL either performed or assessed with clinical judgement   ADL Overall ADL's : Needs assistance/impaired Eating/Feeding: Set up;Sitting   Grooming: Minimal assistance;Wash/dry hands;Standing Grooming Details (indicate cue type and reason): Min guard A initially, but pt with incresaed pain and min A to retrieve paper towel Upper Body Bathing: Set up;Supervision/ safety;Sitting   Lower Body Bathing: Moderate assistance;Sit to/from stand   Upper Body Dressing : Sitting;Minimal assistance   Lower Body Dressing: Moderate assistance;Sit to/from stand   Toilet Transfer: Min guard;Ambulation;Rolling walker (2 wheels) Toilet Transfer Details (indicate cue type and reason): significantly increased time to make it from bed to restroom Toileting- Clothing Manipulation and Hygiene:  Min guard;Sit to/from stand       Functional mobility during ADLs: Min guard;Rolling walker (2 wheels) General ADL Comments: Per son, pt with more pain and slowed movement     Vision Baseline Vision/History: 0 No visual deficits Ability to See in Adequate Light: 0  Adequate Patient Visual Report: No change from baseline Vision Assessment?: No apparent visual deficits     Perception Perception Perception Tested?: No   Praxis Praxis Praxis tested?: Not tested    Pertinent Vitals/Pain Pain Assessment Pain Assessment: Faces Faces Pain Scale: Hurts whole lot Pain Location: mid back Pain Descriptors / Indicators: Discomfort, Grimacing Pain Intervention(s): Limited activity within patient's tolerance, Monitored during session, Repositioned, Relaxation, Premedicated before session (Pt requesting pain patch like one she received yesterday; RN notified)     Hand Dominance     Extremity/Trunk Assessment Upper Extremity Assessment Upper Extremity Assessment: Generalized weakness;RUE deficits/detail;LUE deficits/detail RUE Deficits / Details: Pt reports tingling and numbness in all 5 digits. Pt with decr strength as compared to R (3+/5). Effortful to reach head with BUE, but esp the R hand. ROM overall WFL. Muscular tightness surrounding scapula on palpation. Able to perform scapular elevation/depression, protraction, upward rotation [facility]. Tremor observed when attempting shoulder forward flexion RUE Sensation:  (numbness and tingling) LUE Deficits / Details: Decr strength 4/5 LUE Sensation:  (reports tingling and numbness in the digits)   Lower Extremity Assessment Lower Extremity Assessment: Defer to PT evaluation   Cervical / Trunk Assessment Cervical / Trunk Assessment: Normal   Communication Communication Communication: Prefers language other than English (speaks some english and son interprets as needed)   Cognition Arousal/Alertness: Awake/alert Behavior During Therapy: WFL for tasks assessed/performed Overall Cognitive Status: Within Functional Limits for tasks assessed                                 General Comments: Son reports cognition is at baseline     General Comments  Son present and serving as interpreter as  needed. Reporting that pain in midback is worse today.    Exercises Exercises: Other exercises Other Exercises Other Exercises: scapular mobility via shoulder shrugs and protraction/retraction to reduce tightness and pain. performing 10x Other Exercises: composite flexion/extension 10x Other Exercises: spinal stretch with rounding of shoulders and then sitting up tall with extension of spine 10x Other Exercises: manual therapy to rhomboids and back musculature 3 mins   Shoulder Instructions      Home Living Family/patient expects to be discharged to:: Private residence Living Arrangements: Spouse/significant other;Children Available Help at Discharge: Family;Available 24 hours/day Type of Home: House Home Access: Level entry     Home Layout: Two level;Able to live on main level with bedroom/bathroom Alternate Level Stairs-Number of Steps: 13   Bathroom Shower/Tub: Teacher, early years/pre: Standard     Home Equipment: None   Additional Comments: lives with 4 sons and husband      Prior Functioning/Environment Prior Level of Function : Independent/Modified Independent             Mobility Comments: denies use of assistive device prior to fall; denies balance problems prior to slip/fall ADLs Comments: independent        OT Problem List: Decreased strength;Impaired balance (sitting and/or standing);Decreased activity tolerance;Decreased coordination;Decreased knowledge of use of DME or AE;Decreased safety awareness;Pain      OT Treatment/Interventions: Self-care/ADL training;Neuromuscular education;DME and/or AE instruction;Balance training;Patient/family education;Therapeutic activities  OT Goals(Current goals can be found in the care plan section) Acute Rehab OT Goals Patient Stated Goal: decreased pain OT Goal Formulation: With patient Time For Goal Achievement: 11/14/22 Potential to Achieve Goals: Good  OT Frequency: Min 2X/week    Co-evaluation               AM-PAC OT "6 Clicks" Daily Activity     Outcome Measure Help from another person eating meals?: None Help from another person taking care of personal grooming?: A Little Help from another person toileting, which includes using toliet, bedpan, or urinal?: A Little Help from another person bathing (including washing, rinsing, drying)?: A Little Help from another person to put on and taking off regular upper body clothing?: A Little Help from another person to put on and taking off regular lower body clothing?: A Lot 6 Click Score: 18   End of Session Equipment Utilized During Treatment: Gait belt;Rolling walker (2 wheels) Nurse Communication: Mobility status;Other (comment) (pain in midback, tingling in hands and trembling or RUE with mobility. RN to notify MD)  Activity Tolerance: Patient tolerated treatment well Patient left: in bed;with call bell/phone within reach;with family/visitor present  OT Visit Diagnosis: Unsteadiness on feet (R26.81);Muscle weakness (generalized) (M62.81);History of falling (Z91.81);Pain Pain - part of body:  (back)                Time: [id number] OT Time Calculation (min): 29 min Charges:  OT General Charges $OT Visit: 1 Visit OT Evaluation $OT Eval Low Complexity: 1 Low OT Treatments $Self Care/Home Management : 8-22 mins  Elder Cyphers, OTR/L [facility] Acute Rehabilitation Office: [phone]   Magnus Ivan 10/31/2022, 10:30 AM

## 2022-10-31 NOTE — Progress Notes (Signed)
   Daily Progress Note Intern Pager: [phone]  Patient name: Rachel Robertson Medical record number: [id number] Date of birth: 1966-02-12 Age: 57 y.o. Gender: female  Primary Care Provider: Nolene Ebbs, MD Consultants: None Code Status: FULL code   Pt Overview and Major Events to Date:  2/17 - admitted  Assessment and Plan: Rachel Robertson is a 57 y.o. female presenting with low back and sacral pain secondary to a fall. Differential for this patient includes trauma with associated muscle tenderness (most likely given mechanical fall), occult fracture (negative lumbar/sacral MRI) and cauda equina syndrome (denies bladder or bowel incontinence), pyelonephritis (no urinary of abdominal symptoms).  * Sacral pain Endorses 6-7 /10 pain today. Has not requested oxycodone PRNs -Admitted to FMTS service, Dr. McDiarmid attending -Pain management: Sch Tylenol 61m q6h, sch Toradol 1108mq6h, Oxycodone 24m74m4h prn, Lidocaine patchs, home gabapentin 300 mg TID -Cardiac monitoring x 12 hours to assess for arrhythmia -PT/OT -Fall precuations  Essential hypertension Normotensive this AM -continue home Losartan 35m36mype 2 diabetes mellitus with hyperglycemia (HCC) Glucose in 200s -Cont home meds Lantus 20U AM and 10U PM -sSSI -F/u A1c  FEN/GI: Carb modified VTE Prophylaxis: Lovenox  Subjective:  Endorses 6-7 / 10 pain. Endorses good sleep due to receiving sleep medicine. When asked if she feels the pain is tolerable enough to be managed at home, she says, "up to doctor - if you want me to stay I stay, if you say I go, I go." Denies any other somatic complaints.  Objective: Temp:  [97.8 F (36.6 C)-98 F (36.7 C)] 98 F (36.7 C) (02/18 0900) Pulse Rate:  [69-76] 76 (02/18 0900) Resp:  [17-18] 18 (02/18 0900) BP: (110-157)/(56-60) 157/57 (02/18 0900) SpO2:  [99 %-100 %] 99 % (02/18 0900) Physical Exam: General: in no acute distress and well-appearing HEENT: normocephalic and  atraumatic Respiratory: non-labored breathing and on RA Extremities: moving all extremities spontaneously Gastrointestinal: non-distended Cardiovascular: regular rate  Laboratory: Most recent CBC Lab Results  Component Value Date   WBC 14.6 (H) 05/03/2022   HGB 13.3 05/03/2022   HCT 40.0 05/03/2022   MCV 75.9 (L) 05/03/2022   PLT 207 05/03/2022   Most recent BMP    Latest Ref Rng & Units 10/31/2022    2:47 AM  BMP  Glucose 70 - 99 mg/dL 216   BUN 6 - 20 mg/dL 13   Creatinine 0.44 - 1.00 mg/dL 1.12   Sodium 135 - 145 mmol/L 133   Potassium 3.5 - 5.1 mmol/L 3.6   Chloride 98 - 111 mmol/L 98   CO2 22 - 32 mmol/L 27   Calcium 8.9 - 10.3 mg/dL 8.6    Flem Enderle,Camelia Phenes 10/31/2022, 12:38 PM  PGY-1, [facility] pager: [phone] pages welcome Secure chat group [facility] [place]

## 2022-10-31 NOTE — Discharge Summary (Signed)
[facility] Discharge Summary  Patient name: Rachel Robertson Medical record number: [id number] Date of birth: 03-Aug-1966 Age: 57 y.o. Gender: female Date of Admission: 10/29/2022  Date of Discharge: 10/31/2022  Admitting Physician: Blane Ohara McDiarmid, MD  Primary Care Provider: Nolene Ebbs, MD Consultants: none  Indication for Hospitalization: sacral pain  Brief Hospital Course:  Rachel Robertson is a 57 y.o.female with a history of HTN, T2DM who was admitted to the [facility] Medicine Teaching Service at [facility] for sacral pain. Her hospital course is detailed below:  Sacral Pain Admitted for pain control in the setting of a fall on her tailbone. No significant findings on imaging. Pain adequately controlled on scheduled and PRN medications while hospitalized.  Essential Hypertension Chronic problem. Continued home antihypertensive during hospitalization  DM II Chronic problem. Continued home meds while hospitalized  PCP Follow-up Recommendations:  Diabetes education, medication adherence F/u pain regimen  Discharge Diagnoses/Problem List:  Sacral pain Essential hypertension Type 2 diabetes mellitus with hyperglycemia  Disposition: home  Discharge Condition: stable  Discharge Exam: BP (!) 157/57 (BP Location: Left Arm)   Pulse 76   Temp 98 F (36.7 C) (Oral)   Resp 18   LMP 02/16/2012   SpO2 99%  General: in no acute distress and well-appearing HEENT: normocephalic and atraumatic Respiratory: clear to auscultation bilaterally posteriorly, non-labored breathing, and on RA Extremities: moving all extremities spontaneously Gastrointestinal: non-tender and non-distended Cardiovascular: regular rate  Significant Labs and Imaging:  No results for input(s): "WBC", "HGB", "HCT", "PLT" in the last 48 hours. Recent Labs  Lab 10/30/22 0809 10/31/22 0247  NA 132* 133*  K 4.2 3.6  CL 99 98  CO2 23 27  GLUCOSE 324* 216*  BUN 9 13  CREATININE 0.83  1.12*  CALCIUM 8.5* 8.6*   Discharge Medications:  Allergies as of 10/31/2022       Reactions   Metformin Other (See Comments)    GI intolerance        Medication List     STOP taking these medications    ibuprofen 800 MG tablet Commonly known as: ADVIL   omeprazole 20 MG capsule Commonly known as: PRILOSEC   ondansetron 4 MG disintegrating tablet Commonly known as: ZOFRAN-ODT   tiZANidine 4 MG tablet Commonly known as: ZANAFLEX       TAKE these medications    acetaminophen 325 MG tablet Commonly known as: TYLENOL Take 2 tablets (650 mg total) by mouth every 6 (six) hours.   cetirizine 10 MG tablet Commonly known as: ZYRTEC Take 10 mg by mouth daily as needed for allergies.   cyanocobalamin 1000 MCG/ML injection Commonly known as: VITAMIN B12 Inject 1,000 mcg into the skin every 30 (thirty) days.   diclofenac Sodium 1 % Gel Commonly known as: VOLTAREN Apply 2 g topically 4 (four) times daily as needed (pain).   diphenoxylate-atropine 2.5-0.025 MG tablet Commonly known as: LOMOTIL Take 1 tablet by mouth every 6 (six) hours as needed for diarrhea or loose stools.   gabapentin 300 MG capsule Commonly known as: NEURONTIN Take 1 capsule (300 mg total) by mouth 3 (three) times daily. What changed:  medication strength how much to take   glimepiride 4 MG tablet Commonly known as: AMARYL Take 4 mg by mouth 2 (two) times daily.   hydrOXYzine 25 MG tablet Commonly known as: ATARAX Take 25 mg by mouth 2 (two) times daily as needed for anxiety.   lidocaine 5 % Commonly known as: [place]  1 patch onto the skin daily. Remove & Discard patch within 12 hours or as directed by MD Start taking on: November 01, 2022   losartan 50 MG tablet Commonly known as: COZAAR Take 50 mg by mouth daily.   methocarbamol 750 MG tablet Commonly known as: ROBAXIN Take 750 mg by mouth 2 (two) times daily as needed for muscle spasms.   oxyCODONE 5 MG immediate  release tablet Commonly known as: Oxy IR/ROXICODONE Take 1 tablet (5 mg total) by mouth every 8 (eight) hours as needed for up to 5 days for severe pain or moderate pain.   pantoprazole 40 MG tablet Commonly known as: PROTONIX Take 40 mg by mouth 2 (two) times daily.   simvastatin 10 MG tablet Commonly known as: ZOCOR Take 10 mg by mouth at bedtime.   sitaGLIPtin 100 MG tablet Commonly known as: JANUVIA Take 100 mg by mouth daily.   Tyler Aas FlexTouch 100 UNIT/ML FlexTouch Pen Generic drug: insulin degludec Inject 10-20 Units into the skin See admin instructions. Inject 20 units subcutaneously in the morning and inject 10 units subcutaneously in the evening.        Discharge Instructions: Please refer to Patient Instructions section of EMR for full details.  Patient was counseled important signs and symptoms that should prompt return to medical care, changes in medications, dietary instructions, activity restrictions, and follow up appointments.   Follow-Up Appointments:  Follow-up Information     Nicholes Stairs, MD.   Specialty: Orthopedic Surgery Contact information: [address] [place] [address] [id number]                 Camelia Phenes, MD 10/31/2022, 4:31 PM PGY-1, [place]

## 2022-11-08 DIAGNOSIS — M545 Low back pain, unspecified: Secondary | ICD-10-CM | POA: Diagnosis not present

## 2022-11-08 DIAGNOSIS — E1140 Type 2 diabetes mellitus with diabetic neuropathy, unspecified: Secondary | ICD-10-CM | POA: Diagnosis not present

## 2022-11-08 DIAGNOSIS — I10 Essential (primary) hypertension: Secondary | ICD-10-CM | POA: Diagnosis not present

## 2022-11-08 DIAGNOSIS — E538 Deficiency of other specified B group vitamins: Secondary | ICD-10-CM | POA: Diagnosis not present

## 2022-11-12 DIAGNOSIS — Z419 Encounter for procedure for purposes other than remedying health state, unspecified: Secondary | ICD-10-CM | POA: Diagnosis not present

## 2022-12-12 ENCOUNTER — Emergency Department (HOSPITAL_COMMUNITY): Payer: Medicaid Other

## 2022-12-12 ENCOUNTER — Encounter (HOSPITAL_COMMUNITY): Payer: Self-pay

## 2022-12-12 ENCOUNTER — Emergency Department (HOSPITAL_COMMUNITY)
Admission: EM | Admit: 2022-12-12 | Discharge: 2022-12-12 | Disposition: A | Discharge status: Home / Self Care | Payer: Medicaid Other | Attending: Emergency Medicine | Admitting: Emergency Medicine

## 2022-12-12 DIAGNOSIS — R197 Diarrhea, unspecified: Secondary | ICD-10-CM | POA: Diagnosis not present

## 2022-12-12 DIAGNOSIS — R112 Nausea with vomiting, unspecified: Secondary | ICD-10-CM | POA: Diagnosis not present

## 2022-12-12 DIAGNOSIS — R509 Fever, unspecified: Secondary | ICD-10-CM | POA: Insufficient documentation

## 2022-12-12 DIAGNOSIS — R1013 Epigastric pain: Secondary | ICD-10-CM | POA: Insufficient documentation

## 2022-12-12 LAB — COMPREHENSIVE METABOLIC PANEL
ALT: 19 U/L (ref 0–44)
AST: 20 U/L (ref 15–41)
Albumin: 3.3 g/dL — ABNORMAL LOW (ref 3.5–5.0)
Alkaline Phosphatase: 90 U/L (ref 38–126)
Anion gap: 8 (ref 5–15)
BUN: 9 mg/dL (ref 6–20)
CO2: 21 mmol/L — ABNORMAL LOW (ref 22–32)
Calcium: 8.3 mg/dL — ABNORMAL LOW (ref 8.9–10.3)
Chloride: 107 mmol/L (ref 98–111)
Creatinine, Ser: 0.87 mg/dL (ref 0.44–1.00)
GFR, Estimated: >60 mL/min (ref >60)
Glucose, Bld: 168 mg/dL — ABNORMAL HIGH (ref 70–99)
Potassium: 3.8 mmol/L (ref 3.5–5.1)
Sodium: 136 mmol/L (ref 135–145)
Total Bilirubin: 0.6 mg/dL (ref 0.3–1.2)
Total Protein: 6.9 g/dL (ref 6.5–8.1)

## 2022-12-12 LAB — URINALYSIS, ROUTINE W REFLEX MICROSCOPIC
Bilirubin Urine: NEGATIVE
Glucose, UA: NEGATIVE mg/dL
Hgb urine dipstick: NEGATIVE
Ketones, ur: NEGATIVE mg/dL
Nitrite: NEGATIVE
Protein, ur: 30 mg/dL — AB
Specific Gravity, Urine: 1.024 (ref 1.005–1.030)
pH: 5 (ref 5.0–8.0)

## 2022-12-12 LAB — CBC
HCT: 41.8 % (ref 36.0–46.0)
Hemoglobin: 12.8 g/dL (ref 12.0–15.0)
MCH: 24.1 pg — ABNORMAL LOW (ref 26.0–34.0)
MCHC: 30.6 g/dL (ref 30.0–36.0)
MCV: 78.7 fL — ABNORMAL LOW (ref 80.0–100.0)
Platelets: 198 10*3/uL (ref 150–400)
RBC: 5.31 MIL/uL — ABNORMAL HIGH (ref 3.87–5.11)
RDW: 14.6 % (ref 11.5–15.5)
WBC: 10.9 10*3/uL — ABNORMAL HIGH (ref 4.0–10.5)
nRBC: 0 % (ref 0.0–0.2)

## 2022-12-12 LAB — LIPASE, BLOOD: Lipase: 28 U/L (ref 11–51)

## 2022-12-12 LAB — CBG MONITORING, ED: Glucose-Capillary: 145 mg/dL — ABNORMAL HIGH (ref 70–99)

## 2022-12-12 MED ORDER — LACTATED RINGERS IV BOLUS
1000.0000 mL | Freq: Once | INTRAVENOUS | Status: AC
  Administered 2022-12-12: 1000 mL via INTRAVENOUS

## 2022-12-12 MED ORDER — ACETAMINOPHEN 500 MG PO TABS
1000.0000 mg | ORAL_TABLET | Freq: Once | ORAL | Status: AC
  Administered 2022-12-12: 1000 mg via ORAL
  Filled 2022-12-12: qty 2

## 2022-12-12 MED ORDER — ONDANSETRON HCL 4 MG/2ML IJ SOLN
4.0000 mg | Freq: Once | INTRAMUSCULAR | Status: AC
  Administered 2022-12-12: 4 mg via INTRAVENOUS
  Filled 2022-12-12: qty 2

## 2022-12-12 MED ORDER — IOPAMIDOL (ISOVUE-370) INJECTION 76%
75.0000 mL | Freq: Once | INTRAVENOUS | Status: AC | PRN
  Administered 2022-12-12: 75 mL via INTRAVENOUS

## 2022-12-12 MED ORDER — KETOROLAC TROMETHAMINE 15 MG/ML IJ SOLN
15.0000 mg | Freq: Once | INTRAMUSCULAR | Status: AC
  Administered 2022-12-12: 15 mg via INTRAVENOUS
  Filled 2022-12-12: qty 1

## 2022-12-12 MED ORDER — ONDANSETRON HCL 4 MG PO TABS: 4.0000 mg | ORAL_TABLET | Freq: Four times a day (QID) | ORAL | 0 refills | Status: AC | PRN

## 2022-12-12 NOTE — ED Triage Notes (Signed)
Patient complains of abdominal pain and diarrhea that started two days ago. Patient is afebrile, alert, oriented, ambulating independently with steady gait.

## 2022-12-12 NOTE — ED Provider Notes (Signed)
[facility] Provider Note   CSN: [id number] Arrival date & time: 12/12/22  1314     History Chief Complaint  Patient presents with   Diarrhea    HPI Rachel Robertson is a 57 y.o. female presenting for chief complaint of abdominal pain for 48 hours.  She is a 57 year old female with a minimal medical history otherwise.  Endorses subjective fevers chills abdominal pain denies nausea vomiting today.  Has had substantial diarrhea over this timeframe.  Pain is bilateral in nature radiating to her epigastrium.  Otherwise ambulatory and tolerating p.o. intake..   Patient's recorded medical, surgical, social, medication list and allergies were reviewed in the Snapshot window as part of the initial history.   Review of Systems   Review of Systems  Constitutional:  Negative for chills and fever.  HENT:  Negative for ear pain and sore throat.   Eyes:  Negative for pain and visual disturbance.  Respiratory:  Negative for cough and shortness of breath.   Cardiovascular:  Negative for chest pain and palpitations.  Gastrointestinal:  Positive for abdominal pain, diarrhea, nausea and vomiting.  Genitourinary:  Negative for dysuria and hematuria.  Musculoskeletal:  Negative for arthralgias and back pain.  Skin:  Negative for color change and rash.  Neurological:  Negative for seizures and syncope.  All other systems reviewed and are negative.   Physical Exam Updated Vital Signs BP (!) 153/59   Pulse (!) 58   Temp 97.7 F (36.5 C) (Oral)   Resp 15   Ht 5\' 5"  (1.651 m)   Wt 72.6 kg   LMP 02/16/2012   SpO2 100%   BMI 26.63 kg/m  Physical Exam Vitals and nursing note reviewed.  Constitutional:      General: She is not in acute distress.    Appearance: She is well-developed.  HENT:     Head: Normocephalic and atraumatic.  Eyes:     Conjunctiva/sclera: Conjunctivae normal.  Cardiovascular:     Rate and Rhythm: Normal rate and regular rhythm.      Heart sounds: No murmur heard. Pulmonary:     Effort: Pulmonary effort is normal. No respiratory distress.     Breath sounds: Normal breath sounds.  Abdominal:     General: There is no distension.     Palpations: Abdomen is soft.     Tenderness: There is no abdominal tenderness. There is no right CVA tenderness or left CVA tenderness.  Musculoskeletal:        General: No swelling or tenderness. Normal range of motion.     Cervical back: Neck supple.  Skin:    General: Skin is warm and dry.  Neurological:     General: No focal deficit present.     Mental Status: She is alert and oriented to person, place, and time. Mental status is at baseline.     Cranial Nerves: No cranial nerve deficit.      ED Course/ Medical Decision Making/ A&P    Procedures Procedures   Medications Ordered in ED Medications  ondansetron (ZOFRAN) injection 4 mg (4 mg Intravenous Given 12/12/22 1617)  ketorolac (TORADOL) 15 MG/ML injection 15 mg (15 mg Intravenous Given 12/12/22 1617)  acetaminophen (TYLENOL) tablet 1,000 mg (1,000 mg Oral Given 12/12/22 1616)  lactated ringers bolus 1,000 mL (1,000 mLs Intravenous New Bag/Given 12/12/22 1635)  iopamidol (ISOVUE-370) 76 % injection 75 mL (75 mLs Intravenous Contrast Given 12/12/22 1636)   Medical Decision Making:   Rachel Robertson is a 57 y.o. female who presented to the ED today with abdominal pain, detailed above.    Patient's presentation is complicated by their history of multiple comorbid medical problems.  Patient placed on continuous vitals and telemetry monitoring while in ED which was reviewed periodically.  Complete initial physical exam performed, notably the patient  was HDS in NAD.     Reviewed and confirmed nursing documentation for past medical history, family history, social history.    Initial Assessment:   With the patient's presentation of abdominal pain, most likely diagnosis is nonspecific etiology. Other diagnoses were considered  including (but not limited to) gastroenteritis, colitis, small bowel obstruction, appendicitis, cholecystitis, pancreatitis, nephrolithiasis, UTI, pyleonephritis. These are considered less likely due to history of present illness and physical exam findings.   This is most consistent with an acute life/limb threatening illness complicated by underlying chronic conditions.   Initial Plan:  CBC/CMP to evaluate for underlying infectious/metabolic etiology for patient's abdominal pain  Lipase to evaluate for pancreatitis  EKG to evaluate for cardiac source of pain  CTAB/Pelvis with contrast to evaluate for structural/surgical etiology of patients' severe abdominal pain.  Urinalysis and repeat physical assessment to evaluate for UTI/Pyelonpehritis  Empiric management of symptoms with escalating pain control and antiemetics as needed.   Initial Study Results:   Laboratory  All laboratory results reviewed without evidence of clinically relevant pathology.     Radiology All images reviewed independently. Agree with radiology report at this time.   CT ABDOMEN PELVIS W CONTRAST  Result Date: 12/12/2022 CLINICAL DATA:  Abdominal pain, acute, nonlocalized. EXAM: CT ABDOMEN AND PELVIS WITH CONTRAST TECHNIQUE: Multidetector CT imaging of the abdomen and pelvis was performed using the standard protocol following bolus administration of intravenous contrast. RADIATION DOSE REDUCTION: This exam was performed according to the departmental dose-optimization program which includes automated exposure control, adjustment of the mA and/or kV according to patient size and/or use of iterative reconstruction technique. CONTRAST:  31mL ISOVUE-370 IOPAMIDOL (ISOVUE-370) INJECTION 76% COMPARISON:  Abdominopelvic CT 05/03/2022. FINDINGS: Lower chest: Patchy ground-glass opacities and mosaic attenuation are again noted at the visualized lung bases, similar to previous CT. No airspace disease, pleural effusion or suspicious  nodularity. Mild aortic and coronary artery atherosclerosis. Hepatobiliary: The liver is normal in density without suspicious focal abnormality. No evidence of gallstones, gallbladder wall thickening or biliary dilatation. Pancreas: Unremarkable. No pancreatic ductal dilatation or surrounding inflammatory changes. Spleen: Normal in size without focal abnormality. Adrenals/Urinary Tract: Both adrenal glands appear normal. No evidence of urinary tract calculus, suspicious renal lesion or hydronephrosis. The bladder appears unremarkable for its degree of distention. Stomach/Bowel: No enteric contrast administered. The stomach appears unremarkable for its degree of distension. No evidence of bowel wall thickening, distention or surrounding inflammatory change. The appendix appears normal. Vascular/Lymphatic: There are no enlarged abdominal or pelvic lymph nodes. Mild aortic atherosclerosis without evidence of aneurysm or large vessel occlusion. Reproductive: The uterus and ovaries appear unremarkable. No adnexal mass. Other: Intact abdominal wall. No ascites, free air or focal extraluminal fluid collection. Musculoskeletal: No acute or significant osseous findings. IMPRESSION: 1. No acute findings or explanation for the patient's symptoms. 2. Patchy ground-glass opacities and mosaic attenuation at the visualized lung bases, similar to previous CT, suggesting chronic small airways disease. 3.  Aortic Atherosclerosis (ICD10-I70.0). Electronically Signed   By: Richardean Sale M.D.   On: 12/12/2022 16:47    Final Reassessment and Plan:   Patient's history of present illness and physical exam findings do  not reveal any focal pathology.  Symptoms are grossly improved after symptomatic management in emergency room.  Will continue with Zofran as needed, supportive care.  Patient is already arranged follow-up with PCP in a.m.  Stable for outpatient care and management pending symptomatic worsening.  Disposition:  I have  considered need for hospitalization, however, considering all of the above, I believe this patient is stable for discharge at this time.  Patient/family educated about specific return precautions for given chief complaint and symptoms.  Patient/family educated about follow-up with PCP.     Patient/family expressed understanding of return precautions and need for follow-up. Patient spoken to regarding all imaging and laboratory results and appropriate follow up for these results. All education provided in verbal form with additional information in written form. Time was allowed for answering of patient questions. Patient discharged.    Emergency Department Medication Summary:   Medications  ondansetron [facility]) injection 4 mg (4 mg Intravenous Given 12/12/22 1617)  ketorolac (TORADOL) 15 MG/ML injection 15 mg (15 mg Intravenous Given 12/12/22 1617)  acetaminophen (TYLENOL) tablet 1,000 mg (1,000 mg Oral Given 12/12/22 1616)  lactated ringers bolus 1,000 mL (1,000 mLs Intravenous New Bag/Given 12/12/22 1635)  iopamidol (ISOVUE-370) 76 % injection 75 mL (75 mLs Intravenous Contrast Given 12/12/22 1636)         Clinical Impression:  1. Nausea vomiting and diarrhea      Discharge   Final Clinical Impression(s) / ED Diagnoses Final diagnoses:  Nausea vomiting and diarrhea    Rx / DC Orders ED Discharge Orders          Ordered    ondansetron (ZOFRAN) 4 MG tablet  Every 6 hours PRN        12/12/22 1800              Tretha Sciara, MD 12/12/22 1801

## 2022-12-13 DIAGNOSIS — K5289 Other specified noninfective gastroenteritis and colitis: Secondary | ICD-10-CM | POA: Diagnosis not present

## 2022-12-13 DIAGNOSIS — K219 Gastro-esophageal reflux disease without esophagitis: Secondary | ICD-10-CM | POA: Diagnosis not present

## 2022-12-13 DIAGNOSIS — E538 Deficiency of other specified B group vitamins: Secondary | ICD-10-CM | POA: Diagnosis not present

## 2022-12-13 DIAGNOSIS — I10 Essential (primary) hypertension: Secondary | ICD-10-CM | POA: Diagnosis not present

## 2022-12-13 DIAGNOSIS — Z419 Encounter for procedure for purposes other than remedying health state, unspecified: Secondary | ICD-10-CM | POA: Diagnosis not present

## 2022-12-13 DIAGNOSIS — E1140 Type 2 diabetes mellitus with diabetic neuropathy, unspecified: Secondary | ICD-10-CM | POA: Diagnosis not present

## 2023-01-12 DIAGNOSIS — Z419 Encounter for procedure for purposes other than remedying health state, unspecified: Secondary | ICD-10-CM | POA: Diagnosis not present

## 2023-01-24 DIAGNOSIS — I10 Essential (primary) hypertension: Secondary | ICD-10-CM | POA: Diagnosis not present

## 2023-01-24 DIAGNOSIS — M5136 Other intervertebral disc degeneration, lumbar region: Secondary | ICD-10-CM | POA: Diagnosis not present

## 2023-01-24 DIAGNOSIS — E1140 Type 2 diabetes mellitus with diabetic neuropathy, unspecified: Secondary | ICD-10-CM | POA: Diagnosis not present

## 2023-01-24 DIAGNOSIS — L298 Other pruritus: Secondary | ICD-10-CM | POA: Diagnosis not present

## 2023-02-12 DIAGNOSIS — Z419 Encounter for procedure for purposes other than remedying health state, unspecified: Secondary | ICD-10-CM | POA: Diagnosis not present

## 2023-02-25 DIAGNOSIS — J302 Other seasonal allergic rhinitis: Secondary | ICD-10-CM | POA: Diagnosis not present

## 2023-02-25 DIAGNOSIS — E538 Deficiency of other specified B group vitamins: Secondary | ICD-10-CM | POA: Diagnosis not present

## 2023-02-25 DIAGNOSIS — I10 Essential (primary) hypertension: Secondary | ICD-10-CM | POA: Diagnosis not present

## 2023-02-25 DIAGNOSIS — E1140 Type 2 diabetes mellitus with diabetic neuropathy, unspecified: Secondary | ICD-10-CM | POA: Diagnosis not present

## 2023-02-25 DIAGNOSIS — M5136 Other intervertebral disc degeneration, lumbar region: Secondary | ICD-10-CM | POA: Diagnosis not present

## 2023-02-27 DIAGNOSIS — E119 Type 2 diabetes mellitus without complications: Secondary | ICD-10-CM | POA: Diagnosis not present

## 2023-03-06 DIAGNOSIS — H5213 Myopia, bilateral: Secondary | ICD-10-CM | POA: Diagnosis not present

## 2023-03-14 DIAGNOSIS — Z419 Encounter for procedure for purposes other than remedying health state, unspecified: Secondary | ICD-10-CM | POA: Diagnosis not present

## 2023-04-14 DIAGNOSIS — Z419 Encounter for procedure for purposes other than remedying health state, unspecified: Secondary | ICD-10-CM | POA: Diagnosis not present

## 2023-04-25 DIAGNOSIS — J302 Other seasonal allergic rhinitis: Secondary | ICD-10-CM | POA: Diagnosis not present

## 2023-04-25 DIAGNOSIS — I10 Essential (primary) hypertension: Secondary | ICD-10-CM | POA: Diagnosis not present

## 2023-04-25 DIAGNOSIS — E538 Deficiency of other specified B group vitamins: Secondary | ICD-10-CM | POA: Diagnosis not present

## 2023-04-25 DIAGNOSIS — E1140 Type 2 diabetes mellitus with diabetic neuropathy, unspecified: Secondary | ICD-10-CM | POA: Diagnosis not present

## 2023-05-15 DIAGNOSIS — Z419 Encounter for procedure for purposes other than remedying health state, unspecified: Secondary | ICD-10-CM | POA: Diagnosis not present

## 2023-06-06 DIAGNOSIS — E538 Deficiency of other specified B group vitamins: Secondary | ICD-10-CM | POA: Diagnosis not present

## 2023-06-06 DIAGNOSIS — E1140 Type 2 diabetes mellitus with diabetic neuropathy, unspecified: Secondary | ICD-10-CM | POA: Diagnosis not present

## 2023-06-06 DIAGNOSIS — I10 Essential (primary) hypertension: Secondary | ICD-10-CM | POA: Diagnosis not present

## 2023-06-06 DIAGNOSIS — K219 Gastro-esophageal reflux disease without esophagitis: Secondary | ICD-10-CM | POA: Diagnosis not present

## 2023-06-06 DIAGNOSIS — G4700 Insomnia, unspecified: Secondary | ICD-10-CM | POA: Diagnosis not present

## 2023-06-14 DIAGNOSIS — Z419 Encounter for procedure for purposes other than remedying health state, unspecified: Secondary | ICD-10-CM | POA: Diagnosis not present

## 2023-07-06 DIAGNOSIS — I10 Essential (primary) hypertension: Secondary | ICD-10-CM | POA: Diagnosis not present

## 2023-07-06 DIAGNOSIS — E559 Vitamin D deficiency, unspecified: Secondary | ICD-10-CM | POA: Diagnosis not present

## 2023-07-06 DIAGNOSIS — Z0000 Encounter for general adult medical examination without abnormal findings: Secondary | ICD-10-CM | POA: Diagnosis not present

## 2023-07-06 DIAGNOSIS — Z1239 Encounter for other screening for malignant neoplasm of breast: Secondary | ICD-10-CM | POA: Diagnosis not present

## 2023-07-06 DIAGNOSIS — E1140 Type 2 diabetes mellitus with diabetic neuropathy, unspecified: Secondary | ICD-10-CM | POA: Diagnosis not present

## 2023-08-14 DIAGNOSIS — Z419 Encounter for procedure for purposes other than remedying health state, unspecified: Secondary | ICD-10-CM | POA: Diagnosis not present

## 2023-08-17 DIAGNOSIS — I10 Essential (primary) hypertension: Secondary | ICD-10-CM | POA: Diagnosis not present

## 2023-08-17 DIAGNOSIS — E538 Deficiency of other specified B group vitamins: Secondary | ICD-10-CM | POA: Diagnosis not present

## 2023-08-17 DIAGNOSIS — M5136 Other intervertebral disc degeneration, lumbar region with discogenic back pain only: Secondary | ICD-10-CM | POA: Diagnosis not present

## 2023-08-17 DIAGNOSIS — E1140 Type 2 diabetes mellitus with diabetic neuropathy, unspecified: Secondary | ICD-10-CM | POA: Diagnosis not present

## 2023-08-17 DIAGNOSIS — J302 Other seasonal allergic rhinitis: Secondary | ICD-10-CM | POA: Diagnosis not present

## 2023-08-18 ENCOUNTER — Encounter (HOSPITAL_COMMUNITY): Payer: Self-pay

## 2023-08-18 ENCOUNTER — Other Ambulatory Visit: Payer: Self-pay

## 2023-08-18 ENCOUNTER — Emergency Department (HOSPITAL_COMMUNITY)
Admission: EM | Admit: 2023-08-18 | Discharge: 2023-08-19 | Disposition: A | Discharge status: Home / Self Care | Payer: Medicaid Other | Attending: Emergency Medicine | Admitting: Emergency Medicine

## 2023-08-18 DIAGNOSIS — R1013 Epigastric pain: Secondary | ICD-10-CM | POA: Insufficient documentation

## 2023-08-18 DIAGNOSIS — K529 Noninfective gastroenteritis and colitis, unspecified: Secondary | ICD-10-CM

## 2023-08-18 DIAGNOSIS — D72829 Elevated white blood cell count, unspecified: Secondary | ICD-10-CM | POA: Insufficient documentation

## 2023-08-18 DIAGNOSIS — R9431 Abnormal electrocardiogram [ECG] [EKG]: Secondary | ICD-10-CM | POA: Diagnosis not present

## 2023-08-18 DIAGNOSIS — R109 Unspecified abdominal pain: Secondary | ICD-10-CM

## 2023-08-18 DIAGNOSIS — R0789 Other chest pain: Secondary | ICD-10-CM | POA: Diagnosis not present

## 2023-08-18 DIAGNOSIS — R079 Chest pain, unspecified: Secondary | ICD-10-CM | POA: Diagnosis not present

## 2023-08-18 DIAGNOSIS — R188 Other ascites: Secondary | ICD-10-CM | POA: Diagnosis not present

## 2023-08-18 DIAGNOSIS — R140 Abdominal distension (gaseous): Secondary | ICD-10-CM | POA: Diagnosis not present

## 2023-08-18 LAB — COMPREHENSIVE METABOLIC PANEL
ALT: 20 U/L (ref 0–44)
AST: 33 U/L (ref 15–41)
Albumin: 3.5 g/dL (ref 3.5–5.0)
Alkaline Phosphatase: 86 U/L (ref 38–126)
Anion gap: 10 (ref 5–15)
BUN: 6 mg/dL (ref 6–20)
CO2: 25 mmol/L (ref 22–32)
Calcium: 8.8 mg/dL — ABNORMAL LOW (ref 8.9–10.3)
Chloride: 101 mmol/L (ref 98–111)
Creatinine, Ser: 0.84 mg/dL (ref 0.44–1.00)
GFR, Estimated: >60 mL/min (ref >60)
Glucose, Bld: 205 mg/dL — ABNORMAL HIGH (ref 70–99)
Potassium: 4.2 mmol/L (ref 3.5–5.1)
Sodium: 136 mmol/L (ref 135–145)
Total Bilirubin: 1 mg/dL (ref <1.2)
Total Protein: 7.3 g/dL (ref 6.5–8.1)

## 2023-08-18 LAB — URINALYSIS, ROUTINE W REFLEX MICROSCOPIC
Bilirubin Urine: NEGATIVE
Glucose, UA: NEGATIVE mg/dL
Hgb urine dipstick: NEGATIVE
Ketones, ur: NEGATIVE mg/dL
Leukocytes,Ua: NEGATIVE
Nitrite: NEGATIVE
Protein, ur: NEGATIVE mg/dL
Specific Gravity, Urine: 1.003 — ABNORMAL LOW (ref 1.005–1.030)
pH: 6 (ref 5.0–8.0)

## 2023-08-18 LAB — CBC
HCT: 41.5 % (ref 36.0–46.0)
Hemoglobin: 13 g/dL (ref 12.0–15.0)
MCH: 23.9 pg — ABNORMAL LOW (ref 26.0–34.0)
MCHC: 31.3 g/dL (ref 30.0–36.0)
MCV: 76.1 fL — ABNORMAL LOW (ref 80.0–100.0)
Platelets: 230 10*3/uL (ref 150–400)
RBC: 5.45 MIL/uL — ABNORMAL HIGH (ref 3.87–5.11)
RDW: 13.6 % (ref 11.5–15.5)
WBC: 12.6 10*3/uL — ABNORMAL HIGH (ref 4.0–10.5)
nRBC: 0 % (ref 0.0–0.2)

## 2023-08-18 LAB — TROPONIN I (HIGH SENSITIVITY)
Troponin I (High Sensitivity): 5 ng/L (ref <18)
Troponin I (High Sensitivity): 5 ng/L (ref <18)

## 2023-08-18 LAB — LIPASE, BLOOD: Lipase: 25 U/L (ref 11–51)

## 2023-08-18 MED ORDER — ONDANSETRON 4 MG PO TBDP
4.0000 mg | ORAL_TABLET | Freq: Once | ORAL | Status: AC | PRN
  Administered 2023-08-18: 4 mg via ORAL
  Filled 2023-08-18: qty 1

## 2023-08-18 MED ORDER — ALUM & MAG HYDROXIDE-SIMETH 200-200-20 MG/5ML PO SUSP
30.0000 mL | Freq: Once | ORAL | Status: AC
  Administered 2023-08-18: 30 mL via ORAL
  Filled 2023-08-18: qty 30

## 2023-08-18 MED ORDER — LIDOCAINE VISCOUS HCL 2 % MT SOLN
15.0000 mL | Freq: Once | OROMUCOSAL | Status: AC
  Administered 2023-08-18: 15 mL via OROMUCOSAL
  Filled 2023-08-18: qty 15

## 2023-08-18 NOTE — ED Notes (Signed)
Pt declines EKG because of her dress and would prefer to wait until she gets a room in the back to get undress. PS na nurse aware.

## 2023-08-18 NOTE — ED Provider Triage Note (Signed)
Emergency Medicine Provider Triage Evaluation Note  Rachel Robertson , a 57 y.o. female  was evaluated in triage.  Pt complains of epigastric abdominal pain which she describes as a burning sensation.  Has been present for 3 days.  She does take pantoprazole which has been offering little relief.  Pain does radiate into her chest.  She reports associated nausea and intractable vomiting.  Review of Systems  Positive:  Negative: See above   Physical Exam  BP (!) 174/76 (BP Location: Right Arm)   Pulse 89   Temp 99 F (37.2 C) (Oral)   Resp 18   Ht 5\' 2"  (1.575 m)   Wt 77.1 kg   LMP 02/16/2012   SpO2 99%   BMI 31.09 kg/m  Gen:   Awake, no distress   Resp:  Normal effort  MSK:   Moves extremities without difficulty  Other:  Epigastric tenderness  Medical Decision Making  Medically screening exam initiated at 7:49 PM.  Appropriate orders placed.  Rachel Robertson was informed that the remainder of the evaluation will be completed by another provider, this initial triage assessment does not replace that evaluation, and the importance of remaining in the ED until their evaluation is complete.     Honor Loh [place], [place] 08/18/23 1952

## 2023-08-18 NOTE — ED Triage Notes (Signed)
Patient reports emesis x3 days with epigastric burning, belching, and passing gas. Patient reports it feels like heart burn. Patient takes pantoprazole but it has not helped. Patient reports she has been unable to keep anything down at all.

## 2023-08-19 ENCOUNTER — Emergency Department (HOSPITAL_COMMUNITY): Payer: Medicaid Other

## 2023-08-19 DIAGNOSIS — R188 Other ascites: Secondary | ICD-10-CM | POA: Diagnosis not present

## 2023-08-19 DIAGNOSIS — R079 Chest pain, unspecified: Secondary | ICD-10-CM | POA: Diagnosis not present

## 2023-08-19 DIAGNOSIS — R140 Abdominal distension (gaseous): Secondary | ICD-10-CM | POA: Diagnosis not present

## 2023-08-19 MED ORDER — ONDANSETRON 4 MG PO TBDP: 4.0000 mg | ORAL_TABLET | Freq: Three times a day (TID) | ORAL | 0 refills | Status: AC | PRN

## 2023-08-19 MED ORDER — IOHEXOL 350 MG/ML SOLN
75.0000 mL | Freq: Once | INTRAVENOUS | Status: AC | PRN
  Administered 2023-08-19: 75 mL via INTRAVENOUS

## 2023-08-19 MED ORDER — ONDANSETRON HCL 4 MG/2ML IJ SOLN
4.0000 mg | Freq: Once | INTRAMUSCULAR | Status: AC
  Administered 2023-08-19: 4 mg via INTRAVENOUS
  Filled 2023-08-19: qty 2

## 2023-08-19 MED ORDER — PANTOPRAZOLE SODIUM 40 MG IV SOLR
40.0000 mg | Freq: Once | INTRAVENOUS | Status: AC
  Administered 2023-08-19: 40 mg via INTRAVENOUS
  Filled 2023-08-19: qty 10

## 2023-08-19 NOTE — Discharge Instructions (Addendum)
As discussed, your labs and imaging are reassuring.  I have sent a prescription of Zofran to your pharmacy.  You can take this up to 3 times a day as needed for nausea and vomiting.  Follow-up with your primary care provider in 1 week for reevaluation of your symptoms.  If they persist you can follow-up with gastroenterology.  Get help right away if: You have pain in your chest, neck, arm, or jaw. You feel very weak or you faint. You vomit again and again. You have vomit that is bright red or looks like black coffee grounds. You have bloody or black poop (stools) or poop that looks like tar. You have a very bad headache, a stiff neck, or both. You have very bad pain, cramping, or bloating in your belly (abdomen). You have trouble breathing. You are breathing very quickly. Your heart is beating very quickly. Your skin feels cold and clammy. You feel confused. You have signs of losing too much water in your body, such as: Dark pee, very little pee, or no pee. Cracked lips. Dry mouth. Sunken eyes. Sleepiness. Weakness.

## 2023-08-19 NOTE — ED Provider Notes (Signed)
  Physical Exam  BP (!) 147/52 (BP Location: Right Arm)   Pulse 84   Temp 97.6 F (36.4 C) (Oral)   Resp 18   Ht 5\' 2"  (1.575 m)   Wt 77.1 kg   LMP 02/16/2012   SpO2 100%   BMI 31.09 kg/m   Physical Exam  Procedures  Procedures  ED Course / MDM   Clinical Course as of 08/19/23 0643  Thu Aug 18, 2023  1953 Patient declined EKG. [CF]    Clinical Course User Index [CF] Teressa Lower, PA-C   Medical Decision Making Amount and/or Complexity of Data Reviewed Labs: ordered. Radiology: ordered.  Risk Prescription drug management.   Patient care taken over at shift change. Disposition pending CT results and PO challenge.  CT results resulted at 7:37 AM.  Imaging showed fluid-filled but nondilated small bowel and proximal colon, nonspecific but possible enteritis.  No other acute inflammatory process identified in the abdomen or pelvis.  Normal appendix.  Discussed findings with patient.  She is able to tolerate liquids by mouth without nausea, vomiting, or abdominal pain.  Patient is stable and safe for discharge home.  Prescription of Zofran sent to the pharmacy.  Return precautions provided.   Maxwell Marion, PA-C 08/19/23 1610    Glynn Octave, MD 08/19/23 [phone]

## 2023-08-19 NOTE — ED Provider Notes (Signed)
[facility] DEPARTMENT AT [facility] Provider Note   CSN: [id number] Arrival date & time: 08/18/23  4098     History  Chief Complaint  Patient presents with   Emesis    Synai BRANDYLEE Robertson is a 57 y.o. female.  57 year old female presents today for concern of abdominal pain, some chest discomfort that she describes as burning epigastric pain.  Has history of gastritis.  Reports compliance with Protonix.  No hematemesis.  Has not been able to tolerate p.o. intake since 11 AM yesterday.  Is accompanied by her son at bedside.  States the pain radiates into her back.  No melanotic stools.  The history is provided by the patient. No language interpreter was used.       Home Medications Prior to Admission medications   Medication Sig Start Date End Date Taking? Authorizing Provider  acetaminophen (TYLENOL) 325 MG tablet Take 2 tablets (650 mg total) by mouth every 6 (six) hours. 10/31/22   Augusto Gamble, MD  cetirizine (ZYRTEC) 10 MG tablet Take 10 mg by mouth daily as needed for allergies.    [provider]  cyanocobalamin (,VITAMIN B-12,) 1000 MCG/ML injection Inject 1,000 mcg into the skin every 30 (thirty) days.  03/08/17   [provider]  diclofenac Sodium (VOLTAREN) 1 % GEL Apply 2 g topically 4 (four) times daily as needed (pain).    [provider]  diphenoxylate-atropine (LOMOTIL) 2.5-0.025 MG tablet Take 1 tablet by mouth every 6 (six) hours as needed for diarrhea or loose stools.    [provider]  gabapentin (NEURONTIN) 300 MG capsule Take 1 capsule (300 mg total) by mouth 3 (three) times daily. 10/31/22   Augusto Gamble, MD  glimepiride (AMARYL) 4 MG tablet Take 4 mg by mouth 2 (two) times daily.    [provider]  hydrOXYzine (ATARAX/VISTARIL) 25 MG tablet Take 25 mg by mouth 2 (two) times daily as needed for anxiety.  11/15/19   [provider]  lidocaine (LIDODERM) 5 % Place 1 patch onto the skin daily. Remove &  Discard patch within 12 hours or as directed by MD 11/01/22   Augusto Gamble, MD  losartan (COZAAR) 50 MG tablet Take 50 mg by mouth daily. 09/18/19   [provider]  methocarbamol (ROBAXIN) 750 MG tablet Take 750 mg by mouth 2 (two) times daily as needed for muscle spasms. 08/12/20   [provider]  ondansetron (ZOFRAN) 4 MG tablet Take 1 tablet (4 mg total) by mouth every 6 (six) hours as needed for nausea or vomiting. 12/12/22   Glyn Ade, MD  pantoprazole (PROTONIX) 40 MG tablet Take 40 mg by mouth 2 (two) times daily.     [provider]  simvastatin (ZOCOR) 10 MG tablet Take 10 mg by mouth at bedtime.  03/21/17   [provider]  sitaGLIPtin (JANUVIA) 100 MG tablet Take 100 mg by mouth daily.    [provider]  TRESIBA FLEXTOUCH 100 UNIT/ML FlexTouch Pen Inject 10-20 Units into the skin See admin instructions. Inject 20 units subcutaneously in the morning and inject 10 units subcutaneously in the evening. 10/19/22   [provider]  enoxaparin (LOVENOX) 30 MG/0.3ML injection Inject 0.3 mLs (30 mg total) into the skin every 12 (twelve) hours. 03/12/12 03/12/12  Clemetine Marker, MD      Allergies    Metformin    Review of Systems   Review of Systems  Constitutional:  Negative for chills and fever.  Respiratory:  Negative for shortness of breath.   Cardiovascular:  Positive for chest pain.  Gastrointestinal:  Positive for abdominal pain, nausea and vomiting. Negative for blood in stool.  Genitourinary:  Negative for dysuria.  Neurological:  Negative for light-headedness.  All other systems reviewed and are negative.   Physical Exam Updated Vital Signs BP (!) 147/52 (BP Location: Right Arm)   Pulse 84   Temp 97.6 F (36.4 C) (Oral)   Resp 18   Ht 5\' 2"  (1.575 m)   Wt 77.1 kg   LMP 02/16/2012   SpO2 100%   BMI 31.09 kg/m  Physical Exam Vitals and nursing note reviewed.  Constitutional:      General: She is not in acute  distress.    Appearance: Normal appearance. She is not ill-appearing.  HENT:     Head: Normocephalic and atraumatic.     Nose: Nose normal.  Eyes:     General: No scleral icterus.    Extraocular Movements: Extraocular movements intact.     Conjunctiva/sclera: Conjunctivae normal.  Cardiovascular:     Rate and Rhythm: Normal rate and regular rhythm.     Heart sounds: Normal heart sounds.  Pulmonary:     Effort: Pulmonary effort is normal. No respiratory distress.     Breath sounds: Normal breath sounds. No wheezing or rales.  Abdominal:     General: There is no distension.     Tenderness: There is abdominal tenderness. There is no guarding.  Musculoskeletal:        General: Normal range of motion.     Cervical back: Normal range of motion.  Skin:    General: Skin is warm and dry.  Neurological:     General: No focal deficit present.     Mental Status: She is alert. Mental status is at baseline.     ED Results / Procedures / Treatments   Labs (all labs ordered are listed, but only abnormal results are displayed) Labs Reviewed  COMPREHENSIVE METABOLIC PANEL - Abnormal; Notable for the following components:      Result Value   Glucose, Bld 205 (*)    Calcium 8.8 (*)    All other components within normal limits  CBC - Abnormal; Notable for the following components:   WBC 12.6 (*)    RBC 5.45 (*)    MCV 76.1 (*)    MCH 23.9 (*)    All other components within normal limits  URINALYSIS, ROUTINE W REFLEX MICROSCOPIC - Abnormal; Notable for the following components:   Color, Urine STRAW (*)    Specific Gravity, Urine 1.003 (*)    All other components within normal limits  LIPASE, BLOOD  TROPONIN I (HIGH SENSITIVITY)  TROPONIN I (HIGH SENSITIVITY)    EKG EKG Interpretation Date/Time:  Friday August 19 2023 05:45:09 EST Ventricular Rate:  91 PR Interval:  130 QRS Duration:  89 QT Interval:  397 QTC Calculation: 489 R Axis:   124  Text Interpretation: Right and  left arm electrode reversal, interpretation assumes no reversal Sinus rhythm Consider right atrial enlargement Inferolateral infarct, age indeterminate Nonspecific ST abnormality Confirmed by Glynn Octave [id number]) on 08/19/2023 5:49:51 AM  Radiology DG Chest Portable 1 View  Result Date: 08/19/2023 CLINICAL DATA:  57 year old female with chest pain. EXAM: PORTABLE CHEST 1 VIEW COMPARISON:  Portable chest 05/04/2022 and earlier. FINDINGS: Portable AP semi upright views at 0621 hours. Lung volumes and mediastinal contours remain normal. Visualized tracheal air column is within normal limits. Allowing for portable technique  the lungs are clear. No pneumothorax or pleural effusion. No acute osseous abnormality identified. Paucity of bowel gas the visible abdomen. IMPRESSION: No acute cardiopulmonary abnormality. Electronically Signed   By: Odessa Fleming M.D.   On: 08/19/2023 06:30    Procedures Procedures    Medications Ordered in ED Medications  alum & mag hydroxide-simeth (MAALOX/MYLANTA) 200-200-20 MG/5ML suspension 30 mL (30 mLs Oral Given 08/18/23 1959)  lidocaine (XYLOCAINE) 2 % viscous mouth solution 15 mL (15 mLs Mouth/Throat Given 08/18/23 1959)  ondansetron (ZOFRAN-ODT) disintegrating tablet 4 mg (4 mg Oral Given 08/18/23 2311)  pantoprazole (PROTONIX) injection 40 mg (40 mg Intravenous Given 08/19/23 0616)  ondansetron (ZOFRAN) injection 4 mg (4 mg Intravenous Given 08/19/23 1610)    ED Course/ Medical Decision Making/ A&P Clinical Course as of 08/19/23 0643  Thu Aug 18, 2023  1953 Patient declined EKG. [CF]    Clinical Course User Index [CF] Teressa Lower, PA-C                                 Medical Decision Making Amount and/or Complexity of Data Reviewed Labs: ordered. Radiology: ordered.  Risk Prescription drug management.   57 year old female presents today for concern of epigastric abdominal pain with component of chest pain.  Hemodynamically stable.  Has not been able  to tolerate p.o. intake since yesterday.  Will provide symptom control and trial p.o. challenge.  Will obtain CT abdomen pelvis prior to p.o. challenge.  Does have generalized abdominal tenderness however worse in the epigastric region.  Denies UTI symptoms.  No history of CAD.  CBC with mild leukocytosis likely reactive.  Hemoglobin stable.  Lipase within normal.  CMP with preserved renal function, normal electrolytes.  UA without evidence of UTI.  Troponin negative x 2.  Chest x-ray without cardiopulmonary process.  EKG without acute ischemic change.  Low suspicion for ACS.  Signed out to oncoming provider pending CT abdomen pelvis and p.o. trial.   Final Clinical Impression(s) / ED Diagnoses Final diagnoses:  Abdominal pain, unspecified abdominal location    Rx / DC Orders ED Discharge Orders     None         Marita Kansas, PA-C 08/19/23 9604    Glynn Octave, MD 08/19/23 [phone]

## 2023-08-19 NOTE — ED Notes (Signed)
Report received from Loren Racer RN. Assumed care of pt at this time.

## 2023-09-14 DIAGNOSIS — Z419 Encounter for procedure for purposes other than remedying health state, unspecified: Secondary | ICD-10-CM | POA: Diagnosis not present

## 2023-09-26 DIAGNOSIS — M179 Osteoarthritis of knee, unspecified: Secondary | ICD-10-CM | POA: Diagnosis not present

## 2023-09-26 DIAGNOSIS — I10 Essential (primary) hypertension: Secondary | ICD-10-CM | POA: Diagnosis not present

## 2023-09-26 DIAGNOSIS — E538 Deficiency of other specified B group vitamins: Secondary | ICD-10-CM | POA: Diagnosis not present

## 2023-09-26 DIAGNOSIS — E1140 Type 2 diabetes mellitus with diabetic neuropathy, unspecified: Secondary | ICD-10-CM | POA: Diagnosis not present

## 2023-10-15 DIAGNOSIS — Z419 Encounter for procedure for purposes other than remedying health state, unspecified: Secondary | ICD-10-CM | POA: Diagnosis not present

## 2023-11-12 DIAGNOSIS — Z419 Encounter for procedure for purposes other than remedying health state, unspecified: Secondary | ICD-10-CM | POA: Diagnosis not present

## 2023-12-24 DIAGNOSIS — Z419 Encounter for procedure for purposes other than remedying health state, unspecified: Secondary | ICD-10-CM | POA: Diagnosis not present

## 2023-12-28 DIAGNOSIS — E1140 Type 2 diabetes mellitus with diabetic neuropathy, unspecified: Secondary | ICD-10-CM | POA: Diagnosis not present

## 2024-01-17 DIAGNOSIS — N182 Chronic kidney disease, stage 2 (mild): Secondary | ICD-10-CM | POA: Diagnosis not present

## 2024-01-17 DIAGNOSIS — Z7984 Long term (current) use of oral hypoglycemic drugs: Secondary | ICD-10-CM | POA: Diagnosis not present

## 2024-01-17 DIAGNOSIS — E1151 Type 2 diabetes mellitus with diabetic peripheral angiopathy without gangrene: Secondary | ICD-10-CM | POA: Diagnosis not present

## 2024-01-17 DIAGNOSIS — E1165 Type 2 diabetes mellitus with hyperglycemia: Secondary | ICD-10-CM | POA: Diagnosis not present

## 2024-01-17 DIAGNOSIS — E785 Hyperlipidemia, unspecified: Secondary | ICD-10-CM | POA: Diagnosis not present

## 2024-01-17 DIAGNOSIS — F419 Anxiety disorder, unspecified: Secondary | ICD-10-CM | POA: Diagnosis not present

## 2024-01-17 DIAGNOSIS — M1990 Unspecified osteoarthritis, unspecified site: Secondary | ICD-10-CM | POA: Diagnosis not present

## 2024-01-17 DIAGNOSIS — E1142 Type 2 diabetes mellitus with diabetic polyneuropathy: Secondary | ICD-10-CM | POA: Diagnosis not present

## 2024-01-17 DIAGNOSIS — E1122 Type 2 diabetes mellitus with diabetic chronic kidney disease: Secondary | ICD-10-CM | POA: Diagnosis not present

## 2024-01-17 DIAGNOSIS — K219 Gastro-esophageal reflux disease without esophagitis: Secondary | ICD-10-CM | POA: Diagnosis not present

## 2024-01-17 DIAGNOSIS — I129 Hypertensive chronic kidney disease with stage 1 through stage 4 chronic kidney disease, or unspecified chronic kidney disease: Secondary | ICD-10-CM | POA: Diagnosis not present

## 2024-01-23 DIAGNOSIS — Z419 Encounter for procedure for purposes other than remedying health state, unspecified: Secondary | ICD-10-CM | POA: Diagnosis not present

## 2024-01-30 DIAGNOSIS — J302 Other seasonal allergic rhinitis: Secondary | ICD-10-CM | POA: Diagnosis not present

## 2024-01-30 DIAGNOSIS — E1140 Type 2 diabetes mellitus with diabetic neuropathy, unspecified: Secondary | ICD-10-CM | POA: Diagnosis not present

## 2024-01-30 DIAGNOSIS — I10 Essential (primary) hypertension: Secondary | ICD-10-CM | POA: Diagnosis not present

## 2024-02-23 DIAGNOSIS — Z419 Encounter for procedure for purposes other than remedying health state, unspecified: Secondary | ICD-10-CM | POA: Diagnosis not present

## 2024-03-24 DIAGNOSIS — Z419 Encounter for procedure for purposes other than remedying health state, unspecified: Secondary | ICD-10-CM | POA: Diagnosis not present

## 2024-04-24 DIAGNOSIS — Z419 Encounter for procedure for purposes other than remedying health state, unspecified: Secondary | ICD-10-CM | POA: Diagnosis not present

## 2024-05-11 DIAGNOSIS — J302 Other seasonal allergic rhinitis: Secondary | ICD-10-CM | POA: Diagnosis not present

## 2024-05-11 DIAGNOSIS — K219 Gastro-esophageal reflux disease without esophagitis: Secondary | ICD-10-CM | POA: Diagnosis not present

## 2024-05-11 DIAGNOSIS — K5289 Other specified noninfective gastroenteritis and colitis: Secondary | ICD-10-CM | POA: Diagnosis not present

## 2024-05-11 DIAGNOSIS — E1140 Type 2 diabetes mellitus with diabetic neuropathy, unspecified: Secondary | ICD-10-CM | POA: Diagnosis not present

## 2024-05-11 DIAGNOSIS — I10 Essential (primary) hypertension: Secondary | ICD-10-CM | POA: Diagnosis not present

## 2024-05-25 DIAGNOSIS — Z419 Encounter for procedure for purposes other than remedying health state, unspecified: Secondary | ICD-10-CM | POA: Diagnosis not present

## 2024-07-05 ENCOUNTER — Other Ambulatory Visit: Payer: Self-pay

## 2024-07-05 ENCOUNTER — Emergency Department (HOSPITAL_COMMUNITY)

## 2024-07-05 ENCOUNTER — Encounter (HOSPITAL_COMMUNITY): Payer: Self-pay

## 2024-07-05 ENCOUNTER — Emergency Department (HOSPITAL_COMMUNITY)
Admission: EM | Admit: 2024-07-05 | Discharge: 2024-07-06 | Disposition: A | Discharge status: Home / Self Care | Attending: Emergency Medicine | Admitting: Emergency Medicine

## 2024-07-05 DIAGNOSIS — K760 Fatty (change of) liver, not elsewhere classified: Secondary | ICD-10-CM | POA: Diagnosis not present

## 2024-07-05 DIAGNOSIS — R079 Chest pain, unspecified: Secondary | ICD-10-CM | POA: Diagnosis present

## 2024-07-05 DIAGNOSIS — Z79899 Other long term (current) drug therapy: Secondary | ICD-10-CM | POA: Diagnosis not present

## 2024-07-05 DIAGNOSIS — E119 Type 2 diabetes mellitus without complications: Secondary | ICD-10-CM | POA: Diagnosis not present

## 2024-07-05 DIAGNOSIS — K2970 Gastritis, unspecified, without bleeding: Secondary | ICD-10-CM | POA: Diagnosis not present

## 2024-07-05 DIAGNOSIS — Z7984 Long term (current) use of oral hypoglycemic drugs: Secondary | ICD-10-CM | POA: Diagnosis not present

## 2024-07-05 DIAGNOSIS — I10 Essential (primary) hypertension: Secondary | ICD-10-CM | POA: Insufficient documentation

## 2024-07-05 LAB — CBC WITH DIFFERENTIAL/PLATELET
Abs Immature Granulocytes: 0.07 K/uL (ref 0.00–0.07)
Basophils Absolute: 0.1 K/uL (ref 0.0–0.1)
Basophils Relative: 1 %
Eosinophils Absolute: 0.2 K/uL (ref 0.0–0.5)
Eosinophils Relative: 1 %
HCT: 43.3 % (ref 36.0–46.0)
Hemoglobin: 13.8 g/dL (ref 12.0–15.0)
Immature Granulocytes: 0 %
Lymphocytes Relative: 27 %
Lymphs Abs: 4.5 K/uL — ABNORMAL HIGH (ref 0.7–4.0)
MCH: 24.5 pg — ABNORMAL LOW (ref 26.0–34.0)
MCHC: 31.9 g/dL (ref 30.0–36.0)
MCV: 76.9 fL — ABNORMAL LOW (ref 80.0–100.0)
Monocytes Absolute: 0.5 K/uL (ref 0.1–1.0)
Monocytes Relative: 3 %
Neutro Abs: 11.1 K/uL — ABNORMAL HIGH (ref 1.7–7.7)
Neutrophils Relative %: 68 %
Platelets: 223 K/uL (ref 150–400)
RBC: 5.63 MIL/uL — ABNORMAL HIGH (ref 3.87–5.11)
RDW: 14.4 % (ref 11.5–15.5)
WBC: 16.5 K/uL — ABNORMAL HIGH (ref 4.0–10.5)
nRBC: 0 % (ref 0.0–0.2)

## 2024-07-05 LAB — URINALYSIS, ROUTINE W REFLEX MICROSCOPIC
Bilirubin Urine: NEGATIVE
Glucose, UA: NEGATIVE mg/dL
Hgb urine dipstick: NEGATIVE
Ketones, ur: NEGATIVE mg/dL
Nitrite: NEGATIVE
Protein, ur: 30 mg/dL — AB
Specific Gravity, Urine: 1.018 (ref 1.005–1.030)
pH: 6 (ref 5.0–8.0)

## 2024-07-05 LAB — I-STAT CHEM 8, ED
BUN: 7 mg/dL (ref 6–20)
Calcium, Ion: 1.16 mmol/L (ref 1.15–1.40)
Chloride: 104 mmol/L (ref 98–111)
Creatinine, Ser: 0.9 mg/dL (ref 0.44–1.00)
Glucose, Bld: 88 mg/dL (ref 70–99)
HCT: 43 % (ref 36.0–46.0)
Hemoglobin: 14.6 g/dL (ref 12.0–15.0)
Potassium: 4.2 mmol/L (ref 3.5–5.1)
Sodium: 142 mmol/L (ref 135–145)
TCO2: 27 mmol/L (ref 22–32)

## 2024-07-05 LAB — I-STAT VENOUS BLOOD GAS, ED
Acid-Base Excess: 4 mmol/L — ABNORMAL HIGH (ref 0.0–2.0)
Bicarbonate: 28.9 mmol/L — ABNORMAL HIGH (ref 20.0–28.0)
Calcium, Ion: 1.18 mmol/L (ref 1.15–1.40)
HCT: 43 % (ref 36.0–46.0)
Hemoglobin: 14.6 g/dL (ref 12.0–15.0)
O2 Saturation: 41 %
Potassium: 4.2 mmol/L (ref 3.5–5.1)
Sodium: 142 mmol/L (ref 135–145)
TCO2: 30 mmol/L (ref 22–32)
pCO2, Ven: 45 mmHg (ref 44–60)
pH, Ven: 7.417 (ref 7.25–7.43)
pO2, Ven: 23 mmHg — CL (ref 32–45)

## 2024-07-05 LAB — COMPREHENSIVE METABOLIC PANEL WITH GFR
ALT: 15 U/L (ref 0–44)
AST: 19 U/L (ref 15–41)
Albumin: 3.5 g/dL (ref 3.5–5.0)
Alkaline Phosphatase: 77 U/L (ref 38–126)
Anion gap: 14 (ref 5–15)
BUN: 7 mg/dL (ref 6–20)
CO2: 24 mmol/L (ref 22–32)
Calcium: 9.1 mg/dL (ref 8.9–10.3)
Chloride: 102 mmol/L (ref 98–111)
Creatinine, Ser: 0.93 mg/dL (ref 0.44–1.00)
GFR, Estimated: >60 mL/min (ref >60)
Glucose, Bld: 85 mg/dL (ref 70–99)
Potassium: 4.1 mmol/L (ref 3.5–5.1)
Sodium: 140 mmol/L (ref 135–145)
Total Bilirubin: 0.7 mg/dL (ref 0.0–1.2)
Total Protein: 7.5 g/dL (ref 6.5–8.1)

## 2024-07-05 LAB — CBG MONITORING, ED: Glucose-Capillary: 75 mg/dL (ref 70–99)

## 2024-07-05 LAB — TROPONIN I (HIGH SENSITIVITY)
Troponin I (High Sensitivity): 6 ng/L (ref <18)
Troponin I (High Sensitivity): 6 ng/L (ref <18)

## 2024-07-05 LAB — LIPASE, BLOOD: Lipase: 24 U/L (ref 11–51)

## 2024-07-05 LAB — BETA-HYDROXYBUTYRIC ACID: Beta-Hydroxybutyric Acid: 0.26 mmol/L (ref 0.05–0.27)

## 2024-07-05 NOTE — ED Triage Notes (Signed)
 Patient has been vomiting since yesterday, unable to keep any fluids down. Patient does have abdominal pain, has been having normal bowel movements, has been having fever and chills. Denies diarrhea.

## 2024-07-05 NOTE — ED Provider Notes (Signed)
  EMERGENCY DEPARTMENT AT [facility] Provider Note   CSN: [id number] Arrival date & time: 07/05/24  1734     Patient presents with: No chief complaint on file.   Rachel Robertson is a 58 y.o. female.  {Add pertinent medical, surgical, social history, OB history to HPI:32947} HPI     Prior to Admission medications   Medication Sig Start Date End Date Taking? Authorizing Provider  acetaminophen  (TYLENOL ) 325 MG tablet Take 2 tablets (650 mg total) by mouth every 6 (six) hours. 10/31/22   Tan, Daniel, MD  cetirizine (ZYRTEC) 10 MG tablet Take 10 mg by mouth daily as needed for allergies.    [provider]  cyanocobalamin  (,VITAMIN B-12,) 1000 MCG/ML injection Inject 1,000 mcg into the skin every 30 (thirty) days.  03/08/17   [provider]  diclofenac  Sodium (VOLTAREN ) 1 % GEL Apply 2 g topically 4 (four) times daily as needed (pain).    [provider]  diphenoxylate-atropine (LOMOTIL) 2.5-0.025 MG tablet Take 1 tablet by mouth every 6 (six) hours as needed for diarrhea or loose stools.    [provider]  gabapentin  (NEURONTIN ) 300 MG capsule Take 1 capsule (300 mg total) by mouth 3 (three) times daily. 10/31/22   Waymond Sieving, MD  glimepiride (AMARYL) 4 MG tablet Take 4 mg by mouth 2 (two) times daily.    [provider]  hydrOXYzine (ATARAX/VISTARIL) 25 MG tablet Take 25 mg by mouth 2 (two) times daily as needed for anxiety.  11/15/19   [provider]  lidocaine  (LIDODERM ) 5 % Place 1 patch onto the skin daily. Remove & Discard patch within 12 hours or as directed by MD 11/01/22   Waymond Sieving, MD  losartan  (COZAAR ) 50 MG tablet Take 50 mg by mouth daily. 09/18/19   [provider]  methocarbamol (ROBAXIN) 750 MG tablet Take 750 mg by mouth 2 (two) times daily as needed for muscle spasms. 08/12/20   [provider]  ondansetron  (ZOFRAN ) 4 MG tablet Take 1 tablet (4 mg total) by mouth every 6 (six) hours  as needed for nausea or vomiting. 12/12/22   Jerral Meth, MD  pantoprazole  (PROTONIX ) 40 MG tablet Take 40 mg by mouth 2 (two) times daily.     [provider]  simvastatin (ZOCOR) 10 MG tablet Take 10 mg by mouth at bedtime.  03/21/17   [provider]  sitaGLIPtin (JANUVIA) 100 MG tablet Take 100 mg by mouth daily.    [provider]  TRESIBA FLEXTOUCH 100 UNIT/ML FlexTouch Pen Inject 10-20 Units into the skin See admin instructions. Inject 20 units subcutaneously in the morning and inject 10 units subcutaneously in the evening. 10/19/22   [provider]  enoxaparin  (LOVENOX ) 30 MG/0.3ML injection Inject 0.3 mLs (30 mg total) into the skin every 12 (twelve) hours. 03/12/12 03/12/12  Zulema Ny, MD    Allergies: Metformin    Review of Systems  Updated Vital Signs BP (!) 162/68 (BP Location: Right Arm)   Pulse 69   Temp 98.2 F (36.8 C)   Resp 18   Ht 5' 5 (1.651 m)   Wt 77.1 kg   LMP 02/16/2012   SpO2 100%   BMI 28.29 kg/m   Physical Exam  (all labs ordered are listed, but only abnormal results are displayed) Labs Reviewed  CBC WITH DIFFERENTIAL/PLATELET - Abnormal; Notable for the following components:      Result Value   WBC 16.5 (*)  RBC 5.63 (*)    MCV 76.9 (*)    MCH 24.5 (*)    Neutro Abs 11.1 (*)    Lymphs Abs 4.5 (*)    All other components within normal limits  URINALYSIS, ROUTINE W REFLEX MICROSCOPIC - Abnormal; Notable for the following components:   APPearance HAZY (*)    Protein, ur 30 (*)    Leukocytes,Ua LARGE (*)    Bacteria, UA RARE (*)    All other components within normal limits  I-STAT VENOUS BLOOD GAS, ED - Abnormal; Notable for the following components:   pO2, Ven 23 (*)    Bicarbonate 28.9 (*)    Acid-Base Excess 4.0 (*)    All other components within normal limits  URINE CULTURE  COMPREHENSIVE METABOLIC PANEL WITH GFR  LIPASE, BLOOD  BETA-HYDROXYBUTYRIC ACID  I-STAT CHEM 8, ED  CBG MONITORING, ED   TROPONIN I (HIGH SENSITIVITY)  TROPONIN I (HIGH SENSITIVITY)    EKG: None  Radiology: US  Abdomen Limited RUQ (LIVER/GB) Result Date: 07/05/2024 CLINICAL DATA:  Right upper quadrant abdominal pain. EXAM: ULTRASOUND ABDOMEN LIMITED RIGHT UPPER QUADRANT COMPARISON:  CT dated 08/19/2023. FINDINGS: Gallbladder: No gallstones or wall thickening visualized. No sonographic Murphy sign noted by sonographer. Common bile duct: Diameter: 2 mm Liver: There is diffuse increased liver echogenicity most commonly seen in the setting of fatty infiltration. Superimposed inflammation or fibrosis is not excluded. Clinical correlation is recommended. Portal vein is patent on color Doppler imaging with normal direction of blood flow towards the liver. Other: None. IMPRESSION: Fatty liver, otherwise unremarkable right upper quadrant ultrasound. Electronically Signed   By: Vanetta Chou M.D.   On: 07/05/2024 19:59    {Document cardiac monitor, telemetry assessment procedure when appropriate:32947} Procedures   Medications Ordered in the ED - No data to display    {Click here for ABCD2, HEART and other calculators REFRESH Note before signing:1}                              Medical Decision Making Amount and/or Complexity of Data Reviewed Labs: ordered.   ***  {Document critical care time when appropriate  Document review of labs and clinical decision tools ie CHADS2VASC2, etc  Document your independent review of radiology images and any outside records  Document your discussion with family members, caretakers and with consultants  Document social determinants of health affecting pt's care  Document your decision making why or why not admission, treatments were needed:32947:::1}   Final diagnoses:  None    ED Discharge Orders     None

## 2024-07-05 NOTE — ED Provider Triage Note (Signed)
 Emergency Medicine Provider Triage Evaluation Note  Kimber JINNY Dine , a 58 y.o. female  was evaluated in triage.  Pt complains of vomiting since yesterday.  History obtained by son, who is at bedside due to language barrier.  Patient states she has a burning sensation from her epigastrium all the way up to her throat.  She is unable to keep any solids or liquids down.  No history of abdominal surgeries.  No diarrhea.  She does report fevers at home.  Significant right upper quadrant tenderness to palpation.  Positive Murphy's.  Review of Systems  Positive: Vomiting, abdominal pain, fever Negative: Diarrhea  Physical Exam  BP (!) 171/67 (BP Location: Right Arm)   Pulse 78   Temp 98.1 F (36.7 C)   Resp 19   Ht 5' 5 (1.651 m)   Wt 77.1 kg   LMP 02/16/2012   SpO2 96%   BMI 28.29 kg/m  Gen:   Awake, no distress   Resp:  Normal effort  MSK:   Moves extremities without difficulty  Other:  Right upper quadrant tenderness to palpation.  Positive Murphy sign.  Medical Decision Making  Medically screening exam initiated at 7:00 PM.  Appropriate orders placed.  Billye J Leasure was informed that the remainder of the evaluation will be completed by another provider, this initial triage assessment does not replace that evaluation, and the importance of remaining in the ED until their evaluation is complete.  Labs, urine ordered.  RUQ ultrasound ordered.   Torrence Marry RAMAN, [place] 07/05/24 1902

## 2024-07-06 MED ORDER — ONDANSETRON HCL 4 MG/2ML IJ SOLN
4.0000 mg | Freq: Once | INTRAMUSCULAR | Status: AC
  Administered 2024-07-06: 4 mg via INTRAVENOUS
  Filled 2024-07-06: qty 2

## 2024-07-06 MED ORDER — PHENYLEPHRINE HCL-NACL 20-0.9 MG/250ML-% IV SOLN
INTRAVENOUS | Status: AC
  Filled 2024-07-06: qty 500

## 2024-07-06 MED ORDER — ALUM & MAG HYDROXIDE-SIMETH 400-400-40 MG/5ML PO SUSP
15.0000 mL | Freq: Four times a day (QID) | ORAL | 1 refills | Status: AC | PRN
Start: 2024-07-06 — End: ?

## 2024-07-06 MED ORDER — LACTATED RINGERS IV BOLUS
1000.0000 mL | Freq: Once | INTRAVENOUS | Status: AC
  Administered 2024-07-06: 1000 mL via INTRAVENOUS

## 2024-07-06 MED ORDER — PANTOPRAZOLE SODIUM 40 MG PO TBEC
40.0000 mg | DELAYED_RELEASE_TABLET | Freq: Every day | ORAL | 1 refills | Status: AC
Start: 2024-07-06 — End: ?

## 2024-07-06 MED ORDER — PHENYLEPHRINE 80 MCG/ML (10ML) SYRINGE FOR IV PUSH (FOR BLOOD PRESSURE SUPPORT)
PREFILLED_SYRINGE | INTRAVENOUS | Status: AC
Start: 2024-07-06 — End: 2024-07-06
  Filled 2024-07-06: qty 50

## 2024-07-06 MED ORDER — PANTOPRAZOLE SODIUM 40 MG IV SOLR
40.0000 mg | Freq: Once | INTRAVENOUS | Status: AC
  Administered 2024-07-06: 40 mg via INTRAVENOUS
  Filled 2024-07-06: qty 10

## 2024-07-06 MED ORDER — LOSARTAN POTASSIUM 50 MG PO TABS
50.0000 mg | ORAL_TABLET | Freq: Once | ORAL | Status: AC
  Administered 2024-07-06: 50 mg via ORAL
  Filled 2024-07-06: qty 1

## 2024-07-07 LAB — URINE CULTURE

## 2024-08-08 DIAGNOSIS — K219 Gastro-esophageal reflux disease without esophagitis: Secondary | ICD-10-CM | POA: Diagnosis not present

## 2024-08-08 DIAGNOSIS — E1140 Type 2 diabetes mellitus with diabetic neuropathy, unspecified: Secondary | ICD-10-CM | POA: Diagnosis not present

## 2024-08-08 DIAGNOSIS — I10 Essential (primary) hypertension: Secondary | ICD-10-CM | POA: Diagnosis not present

## 2024-08-08 DIAGNOSIS — Z1239 Encounter for other screening for malignant neoplasm of breast: Secondary | ICD-10-CM | POA: Diagnosis not present

## 2024-08-08 DIAGNOSIS — Z0000 Encounter for general adult medical examination without abnormal findings: Secondary | ICD-10-CM | POA: Diagnosis not present

## 2024-08-08 DIAGNOSIS — E559 Vitamin D deficiency, unspecified: Secondary | ICD-10-CM | POA: Diagnosis not present

## 2024-08-21 ENCOUNTER — Other Ambulatory Visit: Payer: Self-pay | Admitting: Internal Medicine

## 2024-08-21 DIAGNOSIS — Z1231 Encounter for screening mammogram for malignant neoplasm of breast: Secondary | ICD-10-CM

## 2024-08-22 ENCOUNTER — Emergency Department (HOSPITAL_COMMUNITY)
Admission: EM | Admit: 2024-08-22 | Discharge: 2024-08-23 | Disposition: A | Discharge status: Home / Self Care | Attending: Emergency Medicine | Admitting: Emergency Medicine

## 2024-08-22 ENCOUNTER — Inpatient Hospital Stay: Admission: RE | Admit: 2024-08-22 | Discharge: 2024-08-22 | Attending: Internal Medicine | Admitting: Internal Medicine

## 2024-08-22 ENCOUNTER — Encounter (HOSPITAL_COMMUNITY): Payer: Self-pay

## 2024-08-22 ENCOUNTER — Other Ambulatory Visit: Payer: Self-pay

## 2024-08-22 DIAGNOSIS — R1013 Epigastric pain: Secondary | ICD-10-CM | POA: Diagnosis not present

## 2024-08-22 DIAGNOSIS — I10 Essential (primary) hypertension: Secondary | ICD-10-CM | POA: Insufficient documentation

## 2024-08-22 DIAGNOSIS — Z7984 Long term (current) use of oral hypoglycemic drugs: Secondary | ICD-10-CM | POA: Insufficient documentation

## 2024-08-22 DIAGNOSIS — Z79899 Other long term (current) drug therapy: Secondary | ICD-10-CM | POA: Diagnosis not present

## 2024-08-22 DIAGNOSIS — R1011 Right upper quadrant pain: Secondary | ICD-10-CM | POA: Diagnosis not present

## 2024-08-22 DIAGNOSIS — D72829 Elevated white blood cell count, unspecified: Secondary | ICD-10-CM | POA: Insufficient documentation

## 2024-08-22 DIAGNOSIS — Z1231 Encounter for screening mammogram for malignant neoplasm of breast: Secondary | ICD-10-CM

## 2024-08-22 DIAGNOSIS — E119 Type 2 diabetes mellitus without complications: Secondary | ICD-10-CM | POA: Insufficient documentation

## 2024-08-22 DIAGNOSIS — K5730 Diverticulosis of large intestine without perforation or abscess without bleeding: Secondary | ICD-10-CM | POA: Diagnosis not present

## 2024-08-22 LAB — COMPREHENSIVE METABOLIC PANEL WITH GFR
ALT: 15 U/L (ref 0–44)
AST: 18 U/L (ref 15–41)
Albumin: 3.6 g/dL (ref 3.5–5.0)
Alkaline Phosphatase: 101 U/L (ref 38–126)
Anion gap: 11 (ref 5–15)
BUN: 8 mg/dL (ref 6–20)
CO2: 27 mmol/L (ref 22–32)
Calcium: 8.9 mg/dL (ref 8.9–10.3)
Chloride: 102 mmol/L (ref 98–111)
Creatinine, Ser: 0.74 mg/dL (ref 0.44–1.00)
GFR, Estimated: >60 mL/min (ref >60)
Glucose, Bld: 74 mg/dL (ref 70–99)
Potassium: 4 mmol/L (ref 3.5–5.1)
Sodium: 140 mmol/L (ref 135–145)
Total Bilirubin: 0.9 mg/dL (ref 0.0–1.2)
Total Protein: 7.7 g/dL (ref 6.5–8.1)

## 2024-08-22 LAB — CBC WITH DIFFERENTIAL/PLATELET
Abs Immature Granulocytes: 0.1 K/uL — ABNORMAL HIGH (ref 0.00–0.07)
Basophils Absolute: 0.1 K/uL (ref 0.0–0.1)
Basophils Relative: 1 %
Eosinophils Absolute: 0 K/uL (ref 0.0–0.5)
Eosinophils Relative: 0 %
HCT: 41.8 % (ref 36.0–46.0)
Hemoglobin: 13.6 g/dL (ref 12.0–15.0)
Immature Granulocytes: 1 %
Lymphocytes Relative: 19 %
Lymphs Abs: 3.2 K/uL (ref 0.7–4.0)
MCH: 24.5 pg — ABNORMAL LOW (ref 26.0–34.0)
MCHC: 32.5 g/dL (ref 30.0–36.0)
MCV: 75.2 fL — ABNORMAL LOW (ref 80.0–100.0)
Monocytes Absolute: 0.6 K/uL (ref 0.1–1.0)
Monocytes Relative: 4 %
Neutro Abs: 12.7 K/uL — ABNORMAL HIGH (ref 1.7–7.7)
Neutrophils Relative %: 75 %
Platelets: 230 K/uL (ref 150–400)
RBC: 5.56 MIL/uL — ABNORMAL HIGH (ref 3.87–5.11)
RDW: 13.6 % (ref 11.5–15.5)
WBC: 16.7 K/uL — ABNORMAL HIGH (ref 4.0–10.5)
nRBC: 0 % (ref 0.0–0.2)

## 2024-08-22 LAB — URINALYSIS, MICROSCOPIC (REFLEX)

## 2024-08-22 LAB — URINALYSIS, ROUTINE W REFLEX MICROSCOPIC
Bilirubin Urine: NEGATIVE
Glucose, UA: NEGATIVE mg/dL
Hgb urine dipstick: NEGATIVE
Ketones, ur: 15 mg/dL — AB
Nitrite: NEGATIVE
Protein, ur: 30 mg/dL — AB
Specific Gravity, Urine: 1.025 (ref 1.005–1.030)
pH: 6.5 (ref 5.0–8.0)

## 2024-08-22 LAB — TROPONIN I (HIGH SENSITIVITY)
Troponin I (High Sensitivity): 6 ng/L (ref <18)
Troponin I (High Sensitivity): 7 ng/L (ref <18)

## 2024-08-22 LAB — LIPASE, BLOOD: Lipase: 22 U/L (ref 11–51)

## 2024-08-22 MED ORDER — ONDANSETRON HCL 4 MG PO TABS
4.0000 mg | ORAL_TABLET | Freq: Once | ORAL | Status: AC
  Administered 2024-08-22: 4 mg via ORAL
  Filled 2024-08-22: qty 1

## 2024-08-22 NOTE — ED Triage Notes (Signed)
 Pt to er, pt family with pt helping to interpret, pt c/o abd pain/epigastric pain, pt states that she has had the pain since yesterday, states that she also has vomiting, states that her pain is worse when she lays flat.

## 2024-08-22 NOTE — ED Triage Notes (Signed)
 Pt came in via POV d/t a burning pain in her mid upper abd & emesis since yesterday. Reports her pain is 10/10 during triage.

## 2024-08-22 NOTE — ED Provider Triage Note (Signed)
 Emergency Medicine Provider Triage Evaluation Note  Kimber JINNY Dine , a 58 y.o. female  was evaluated in triage.  Pt complains of epigastric abdominal pain associate with nausea and vomiting.  No shortness of breath.  Has been seen multiple times for this in the past.  Is on Mounjaro..  Review of Systems  Positive:  Negative:   Physical Exam  BP (!) 157/74 (BP Location: Right Arm)   Pulse 76   Temp 98 F (36.7 C) (Oral)   Resp 18   Ht 5' 5 (1.651 m)   Wt 80.7 kg   LMP 02/16/2012   SpO2 100%   BMI 29.62 kg/m  Gen:   Awake, no distress   Resp:  Normal effort  MSK:   Moves extremities without difficulty  Other:  Epigastric abdominal tenderness  Medical Decision Making  Medically screening exam initiated at 6:07 PM.  Appropriate orders placed.  Lateefa J Enriquez was informed that the remainder of the evaluation will be completed by another provider, this initial triage assessment does not replace that evaluation, and the importance of remaining in the ED until their evaluation is complete.     Donnajean Lynwood DEL, PA-C 08/22/24 1808

## 2024-08-23 ENCOUNTER — Emergency Department (HOSPITAL_COMMUNITY)

## 2024-08-23 DIAGNOSIS — R1013 Epigastric pain: Secondary | ICD-10-CM | POA: Diagnosis not present

## 2024-08-23 MED ORDER — SODIUM CHLORIDE 0.9 % IV BOLUS
1000.0000 mL | Freq: Once | INTRAVENOUS | Status: AC
  Administered 2024-08-23: 1000 mL via INTRAVENOUS

## 2024-08-23 MED ORDER — ALUMINUM-MAGNESIUM-SIMETHICONE 200-200-20 MG/5ML PO SUSP: 30.0000 mL | Freq: Four times a day (QID) | ORAL | 0 refills | Status: AC | PRN

## 2024-08-23 MED ORDER — ONDANSETRON HCL 4 MG/2ML IJ SOLN
4.0000 mg | Freq: Once | INTRAMUSCULAR | Status: AC
  Administered 2024-08-23: 4 mg via INTRAVENOUS
  Filled 2024-08-23: qty 2

## 2024-08-23 MED ORDER — OMEPRAZOLE 20 MG PO CPDR: 20.0000 mg | DELAYED_RELEASE_CAPSULE | Freq: Two times a day (BID) | ORAL | 0 refills | Status: AC

## 2024-08-23 MED ORDER — IOHEXOL 350 MG/ML SOLN
75.0000 mL | Freq: Once | INTRAVENOUS | Status: AC | PRN
  Administered 2024-08-23: 75 mL via INTRAVENOUS

## 2024-08-23 MED ORDER — ALUM & MAG HYDROXIDE-SIMETH 200-200-20 MG/5ML PO SUSP
30.0000 mL | Freq: Once | ORAL | Status: AC
  Administered 2024-08-23: 30 mL via ORAL
  Filled 2024-08-23: qty 30

## 2024-08-23 MED ORDER — MORPHINE SULFATE (PF) 4 MG/ML IV SOLN
4.0000 mg | Freq: Once | INTRAVENOUS | Status: AC
  Administered 2024-08-23: 4 mg via INTRAVENOUS
  Filled 2024-08-23: qty 1

## 2024-08-23 MED ORDER — FAMOTIDINE IN NACL 20-0.9 MG/50ML-% IV SOLN
20.0000 mg | Freq: Once | INTRAVENOUS | Status: AC
  Administered 2024-08-23: 20 mg via INTRAVENOUS
  Filled 2024-08-23: qty 50

## 2024-08-23 NOTE — ED Provider Notes (Signed)
 [place] EMERGENCY DEPARTMENT AT [facility] Provider Note   CSN: [id number] Arrival date & time: 08/22/24  8363     Patient presents with: Abdominal Pain   Rachel Robertson is a 58 y.o. female.   Rachel Robertson is a 58 y.o. female with history of hypertension, diabetes and back pain, who presents to the emergency department for evaluation of epigastric abdominal pain.  Patient reports that pain has been present intermittently for years but in the past few months has gotten worse, pain got significantly worse yesterday and overnight causing her to seek evaluation today.  She reports that she has had a few episodes of vomiting denies any hematemesis.  No diarrhea or constipation and no melena or hematochezia.  Pain is worse when laying flat or after eating.  Does not radiate into the chest.  She denies associated diaphoresis or sweating, no radiation of the pain to the back.  Patient reports she has been seen by her primary doctor and in the emergency department for this pain previously but is not sure exactly what the diagnosis is reports she does not think she has ever had an endoscopy.  Does report that she frequently uses ibuprofen for the pain.  Denies smoking or alcohol use.  No previous abdominal surgeries.  History obtained using Somali interpreter  The history is provided by the patient and medical records. A language interpreter was used.  Abdominal Pain Associated symptoms: nausea and vomiting   Associated symptoms: no chest pain, no chills, no constipation and no fever        Prior to Admission medications  Medication Sig Start Date End Date Taking? Authorizing Provider  aluminum -magnesium  hydroxide-simethicone  (MAALOX) 200-200-20 MG/5ML SUSP Take 30 mLs by mouth 4 (four) times daily as needed. For epigastric pain. 08/23/24  Yes Alva Larraine FALCON, PA-C  omeprazole  (PRILOSEC) 20 MG capsule Take 1 capsule (20 mg total) by mouth 2 (two) times daily before a meal. 08/23/24   Yes Alva Larraine F, PA-C  acetaminophen  (TYLENOL ) 325 MG tablet Take 2 tablets (650 mg total) by mouth every 6 (six) hours. 10/31/22   Tan, Daniel, MD  cetirizine (ZYRTEC) 10 MG tablet Take 10 mg by mouth daily as needed for allergies.    [provider]  cyanocobalamin  (,VITAMIN B-12,) 1000 MCG/ML injection Inject 1,000 mcg into the skin every 30 (thirty) days.  03/08/17   [provider]  diclofenac  Sodium (VOLTAREN ) 1 % GEL Apply 2 g topically 4 (four) times daily as needed (pain).    [provider]  diphenoxylate-atropine (LOMOTIL) 2.5-0.025 MG tablet Take 1 tablet by mouth every 6 (six) hours as needed for diarrhea or loose stools.    [provider]  gabapentin  (NEURONTIN ) 300 MG capsule Take 1 capsule (300 mg total) by mouth 3 (three) times daily. 10/31/22   Waymond Sieving, MD  glimepiride (AMARYL) 4 MG tablet Take 4 mg by mouth 2 (two) times daily.    [provider]  hydrOXYzine (ATARAX/VISTARIL) 25 MG tablet Take 25 mg by mouth 2 (two) times daily as needed for anxiety.  11/15/19   [provider]  lidocaine  (LIDODERM ) 5 % Place 1 patch onto the skin daily. Remove & Discard patch within 12 hours or as directed by MD 11/01/22   Waymond Sieving, MD  losartan  (COZAAR ) 50 MG tablet Take 50 mg by mouth daily. 09/18/19   [provider]  methocarbamol (ROBAXIN) 750 MG tablet Take 750 mg by mouth 2 (two) times daily  as needed for muscle spasms. 08/12/20   [provider]  ondansetron  (ZOFRAN ) 4 MG tablet Take 1 tablet (4 mg total) by mouth every 6 (six) hours as needed for nausea or vomiting. 12/12/22   Jerral Meth, MD  simvastatin (ZOCOR) 10 MG tablet Take 10 mg by mouth at bedtime.  03/21/17   [provider]  sitaGLIPtin (JANUVIA) 100 MG tablet Take 100 mg by mouth daily.    [provider]  TRESIBA FLEXTOUCH 100 UNIT/ML FlexTouch Pen Inject 10-20 Units into the skin See admin instructions. Inject 20 units  subcutaneously in the morning and inject 10 units subcutaneously in the evening. 10/19/22   [provider]  enoxaparin  (LOVENOX ) 30 MG/0.3ML injection Inject 0.3 mLs (30 mg total) into the skin every 12 (twelve) hours. 03/12/12 03/12/12  Zulema Ny, MD    Allergies: Metformin    Review of Systems  Constitutional:  Negative for chills and fever.  Cardiovascular:  Negative for chest pain.  Gastrointestinal:  Positive for abdominal pain, nausea and vomiting. Negative for blood in stool and constipation.  All other systems reviewed and are negative.   Updated Vital Signs BP (!) 163/66   Pulse 74   Temp 98.3 F (36.8 C)   Resp 16   Ht 5' 5 (1.651 m)   Wt 80.7 kg   LMP 02/16/2012   SpO2 100%   BMI 29.62 kg/m   Physical Exam Vitals and nursing note reviewed.  Constitutional:      General: She is not in acute distress.    Appearance: Normal appearance. She is well-developed. She is not diaphoretic.  HENT:     Head: Normocephalic and atraumatic.  Eyes:     General:        Right eye: No discharge.        Left eye: No discharge.     Pupils: Pupils are equal, round, and reactive to light.  Cardiovascular:     Rate and Rhythm: Normal rate and regular rhythm.     Pulses: Normal pulses.     Heart sounds: Normal heart sounds.  Pulmonary:     Effort: Pulmonary effort is normal. No respiratory distress.     Breath sounds: Normal breath sounds. No wheezing or rales.     Comments: Respirations equal and unlabored, patient able to speak in full sentences, lungs clear to auscultation bilaterally  Abdominal:     General: Bowel sounds are normal. There is no distension.     Palpations: Abdomen is soft. There is no mass.     Tenderness: There is abdominal tenderness in the epigastric area. There is no guarding.     Comments: Abdomen soft, nondistended, bowel sounds present throughout, focal tenderness in the epigastric region with some slight right upper quadrant tenderness with  negative Murphy sign, no lower abdominal tenderness, no guarding or peritoneal signs.  Musculoskeletal:        General: No deformity.     Cervical back: Neck supple.  Skin:    General: Skin is warm and dry.     Capillary Refill: Capillary refill takes less than 2 seconds.  Neurological:     Mental Status: She is alert and oriented to person, place, and time.     Coordination: Coordination normal.     Comments: Speech is clear, able to follow commands Moves extremities without ataxia, coordination intact  Psychiatric:        Mood and Affect: Mood normal.        Behavior: Behavior  normal.     (all labs ordered are listed, but only abnormal results are displayed) Labs Reviewed  CBC WITH DIFFERENTIAL/PLATELET - Abnormal; Notable for the following components:      Result Value   WBC 16.7 (*)    RBC 5.56 (*)    MCV 75.2 (*)    MCH 24.5 (*)    Neutro Abs 12.7 (*)    Abs Immature Granulocytes 0.10 (*)    All other components within normal limits  URINALYSIS, ROUTINE W REFLEX MICROSCOPIC - Abnormal; Notable for the following components:   Ketones, ur 15 (*)    Protein, ur 30 (*)    Leukocytes,Ua TRACE (*)    All other components within normal limits  URINALYSIS, MICROSCOPIC (REFLEX) - Abnormal; Notable for the following components:   Bacteria, UA RARE (*)    All other components within normal limits  COMPREHENSIVE METABOLIC PANEL WITH GFR  LIPASE, BLOOD  TROPONIN I (HIGH SENSITIVITY)  TROPONIN I (HIGH SENSITIVITY)    EKG: EKG Interpretation Date/Time:  Wednesday August 22 2024 18:13:38 EST Ventricular Rate:  75 PR Interval:  130 QRS Duration:  82 QT Interval:  392 QTC Calculation: 437 R Axis:   42  Text Interpretation: Normal sinus rhythm Cannot rule out Inferior infarct , age undetermined Abnormal ECG When compared with ECG of 19-Aug-2023 05:45, Arm lead reversal has been corrected, otherwise no significant change Confirmed by Raford Lenis ([id number]) on 08/22/2024 11:25:11  PM  Radiology: CT ABDOMEN PELVIS W CONTRAST Result Date: 08/23/2024 CLINICAL DATA:  Epigastric pain. EXAM: CT ABDOMEN AND PELVIS WITH CONTRAST TECHNIQUE: Multidetector CT imaging of the abdomen and pelvis was performed using the standard protocol following bolus administration of intravenous contrast. RADIATION DOSE REDUCTION: This exam was performed according to the departmental dose-optimization program which includes automated exposure control, adjustment of the mA and/or kV according to patient size and/or use of iterative reconstruction technique. CONTRAST:  75mL OMNIPAQUE  IOHEXOL  350 MG/ML SOLN COMPARISON:  CT abdomen pelvis dated 08/19/2023. FINDINGS: Lower chest: The visualized lung bases are clear. No intra-abdominal free air or free fluid. Hepatobiliary: Probable fatty liver. No biliary ductal dilatation. The gallbladder is unremarkable Pancreas: Unremarkable. No pancreatic ductal dilatation or surrounding inflammatory changes. Spleen: Normal in size without focal abnormality. Adrenals/Urinary Tract: The adrenal glands are unremarkable there is no hydronephrosis on either side. There is symmetric enhancement and excretion of contrast by both kidneys. The visualized ureters and urinary bladder unremarkable. Stomach/Bowel: Several small scattered colonic diverticula noted. Mild thickened appearance of the distal colon likely related to underdistention. Colitis is less likely but not excluded. Clinical correlation recommended. There is no bowel obstruction. The appendix is normal. Vascular/Lymphatic: Mild aortoiliac atherosclerotic disease. The IVC is unremarkable. No portal venous gas. There is no adenopathy. Reproductive: The uterus is anteverted. No suspicious adnexal masses. Other: None Musculoskeletal: No acute or significant osseous findings. IMPRESSION: 1. Underdistention of the colon versus less likely mild colitis. Clinical correlation is recommended no bowel obstruction. Normal appendix. 2.  Mild colonic diverticulosis. 3.  Aortic Atherosclerosis (ICD10-I70.0). Electronically Signed   By: Vanetta Chou M.D.   On: 08/23/2024 13:18     Procedures   Medications Ordered in the ED  ondansetron  (ZOFRAN ) tablet 4 mg (4 mg Oral Given 08/22/24 1837)  ondansetron  (ZOFRAN ) injection 4 mg (4 mg Intravenous Given 08/23/24 1015)  sodium chloride  0.9 % bolus 1,000 mL (0 mLs Intravenous Stopped 08/23/24 1136)  morphine  (PF) 4 MG/ML injection 4 mg (4 mg Intravenous Given 08/23/24 1015)  famotidine  (PEPCID ) IVPB 20 mg premix (0 mg Intravenous Stopped 08/23/24 1058)  alum & mag hydroxide-simeth (MAALOX/MYLANTA) 200-200-20 MG/5ML suspension 30 mL (30 mLs Oral Given 08/23/24 1014)  iohexol  (OMNIPAQUE ) 350 MG/ML injection 75 mL (75 mLs Intravenous Contrast Given 08/23/24 1245)                                    Medical Decision Making Amount and/or Complexity of Data Reviewed Radiology: ordered.  Risk OTC drugs. Prescription drug management.   Patient presents to the ED with complaints of abdominal pain. Patient nontoxic appearing, in no apparent distress, vitals WNL. On exam patient tender to palpation in epigastric region, no peritoneal signs. Will evaluate with labs and CT.   Ddx including but not limited to: Gastritis, GERD, peptic ulcer disease, pancreatitis, cholecystitis, ACS  Additional history obtained:  Additional history obtained from chart review & nursing note review.    EKG: Normal sinus rhythm without acute ischemic changes  Lab Tests:  I Ordered, viewed, and interpreted labs, which included:  CBC: Leukocytosis of 16.7, normal hemoglobin CMP: No electrolyte derangements, normal renal and liver function Lipase: WNL UA: Trace leukocytes with rare bacteria but no other signs of infection Troponin: Negative x 2  Imaging Studies ordered:  I ordered imaging studies which included CT abdomen pelvis, I independently reviewed, formal radiology impression shows:  Under  distention of colon, no clinical signs indicative of colitis no other acute findings  ED Course:  I ordered IV Zofran , morphine , Pepcid  and Maalox  RE-EVAL: Pain significantly improved and patient tolerating p.o.  On repeat abdominal exam patient remains without peritoneal signs, low suspicion for cholecystitis, pancreatitis, diverticulitis, appendicitis, bowel obstruction/perforation,  PID, ectopic pregnancy, or other acute surgical process. Patient tolerating PO in the emergency department. Will discharge home with supportive measures. I discussed results, treatment plan, need for PCP follow-up, and return precautions with the patient. Provided opportunity for questions, patient confirmed understanding and is in agreement with plan.   Portions of this note were generated with Scientist, clinical (histocompatibility and immunogenetics). Dictation errors may occur despite best attempts at proofreading.        Final diagnoses:  Epigastric pain    ED Discharge Orders          Ordered    omeprazole  (PRILOSEC) 20 MG capsule  2 times daily before meals        08/23/24 1426    aluminum -magnesium  hydroxide-simethicone  (MAALOX) 200-200-20 MG/5ML SUSP  4 times daily PRN        08/23/24 1426               Talyssa Gibas F, PA-C 08/28/24 2127    Yolande Lamar BROCKS, MD 08/30/24 1102

## 2024-08-23 NOTE — Discharge Instructions (Signed)
 Take prescribed omeprazole twice daily 30 to 60 minutes before breakfast and dinner.  Use prescribed Maalox as needed for breakthrough pain.  Avoid taking ibuprofen, Motrin, Advil, Aleve , aspirin , Goody powder, can use Tylenol  as needed for other aches and pains.  Follow-up with your primary care doctor and call to schedule follow-up appointment with GI specialist.  If you have significantly worsening pain or noticed blood in vomit or stool return to the ED for reevaluation.

## 2024-08-23 NOTE — ED Notes (Signed)
 Pt was given discharge instructions verbalized understanding. Son at bedside and will take her home.

## 2024-08-24 DIAGNOSIS — Z419 Encounter for procedure for purposes other than remedying health state, unspecified: Secondary | ICD-10-CM | POA: Diagnosis not present
